# Patient Record
Sex: Female | Born: 1980
Health system: Southern US, Community
[De-identification: ages and names within clinical notes are randomized; demographics above are authoritative.]

## PROBLEM LIST (undated history)

## (undated) DIAGNOSIS — N979 Female infertility, unspecified: Secondary | ICD-10-CM

## (undated) DIAGNOSIS — E079 Disorder of thyroid, unspecified: Secondary | ICD-10-CM

## (undated) DIAGNOSIS — E559 Vitamin D deficiency, unspecified: Secondary | ICD-10-CM

## (undated) HISTORY — DX: Disorder of thyroid, unspecified: E07.9

## (undated) HISTORY — DX: Female infertility, unspecified: N97.9

---
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
## 2006-03-05 ENCOUNTER — Ambulatory Visit: Payer: Self-pay | Admitting: Endocrinology

## 2006-06-13 ENCOUNTER — Encounter: Payer: Self-pay | Admitting: Endocrinology

## 2006-06-13 ENCOUNTER — Ambulatory Visit: Payer: Self-pay | Admitting: Endocrinology

## 2006-06-13 ENCOUNTER — Other Ambulatory Visit: Admission: RE | Admit: 2006-06-13 | Discharge: 2006-06-13 | Payer: Self-pay | Admitting: Endocrinology

## 2006-10-16 ENCOUNTER — Encounter: Payer: Self-pay | Admitting: Endocrinology

## 2006-10-16 DIAGNOSIS — E0590 Thyrotoxicosis, unspecified without thyrotoxic crisis or storm: Secondary | ICD-10-CM | POA: Insufficient documentation

## 2006-12-12 ENCOUNTER — Ambulatory Visit: Payer: Self-pay | Admitting: Endocrinology

## 2007-04-09 ENCOUNTER — Ambulatory Visit: Payer: Self-pay | Admitting: Internal Medicine

## 2007-04-09 DIAGNOSIS — R1084 Generalized abdominal pain: Secondary | ICD-10-CM | POA: Insufficient documentation

## 2007-04-09 LAB — CONVERTED CEMR LAB
AST: 19 units/L (ref 0–37)
Albumin: 3.9 g/dL (ref 3.5–5.2)
Alkaline Phosphatase: 41 units/L (ref 39–117)
Total Bilirubin: 0.5 mg/dL (ref 0.3–1.2)

## 2007-04-18 ENCOUNTER — Encounter: Admission: RE | Admit: 2007-04-18 | Discharge: 2007-04-18 | Payer: Self-pay | Admitting: Internal Medicine

## 2007-07-24 ENCOUNTER — Emergency Department (HOSPITAL_COMMUNITY): Admission: EM | Admit: 2007-07-24 | Discharge: 2007-07-24 | Payer: Self-pay | Admitting: Emergency Medicine

## 2007-07-25 ENCOUNTER — Other Ambulatory Visit: Admission: RE | Admit: 2007-07-25 | Discharge: 2007-07-25 | Payer: Self-pay | Admitting: Obstetrics and Gynecology

## 2009-04-01 IMAGING — US US ABDOMEN COMPLETE
1 series · 14 of 25 positions shown · non-contrast
Comparison: None.

CLINICAL DATA: Right upper quadrant epigastric abdominal pain. 
 ABDOMEN ULTRASOUND:
TECHNIQUE: Complete abdominal ultrasound examination was performed including evaluation of the liver, gallbladder, bile ducts, pancreas, kidneys, spleen, IVC, and abdominal aorta.

[Series 1: us abdomen complete · 0.22mm/px · 14 of 111 slices shown]
[im 1/111]
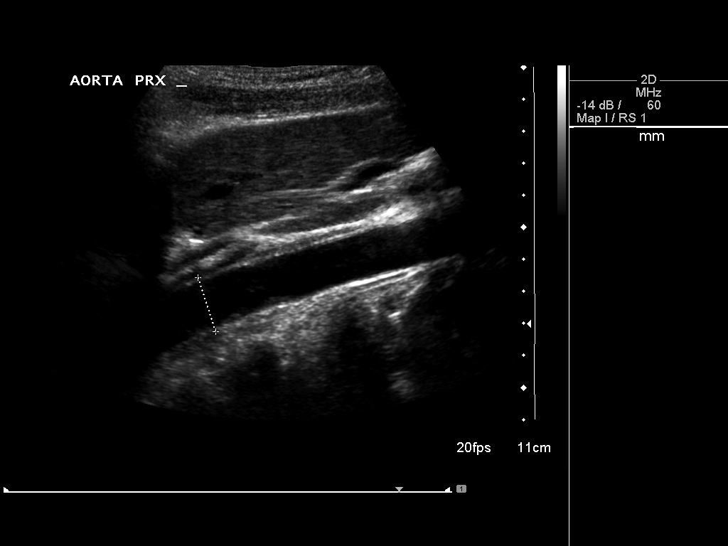
[im 10/111]
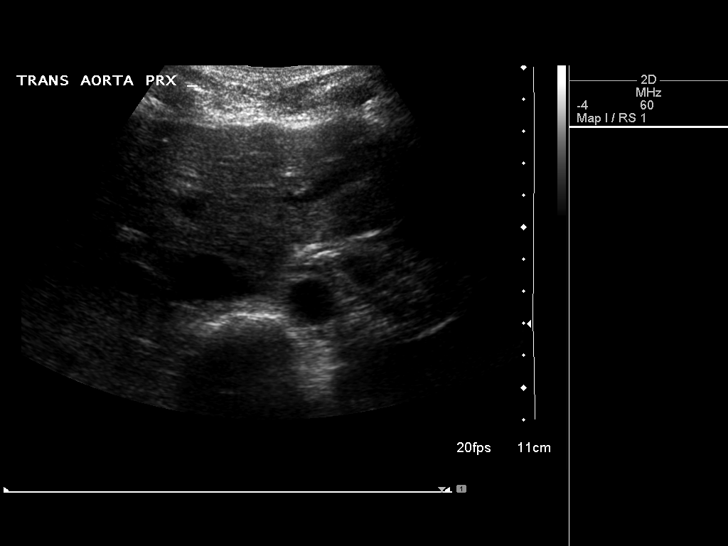
[im 19/111]
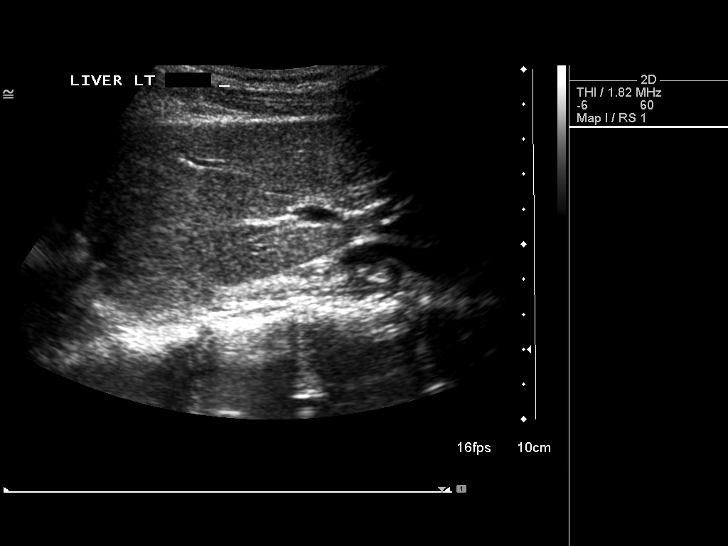
[im 28/111]
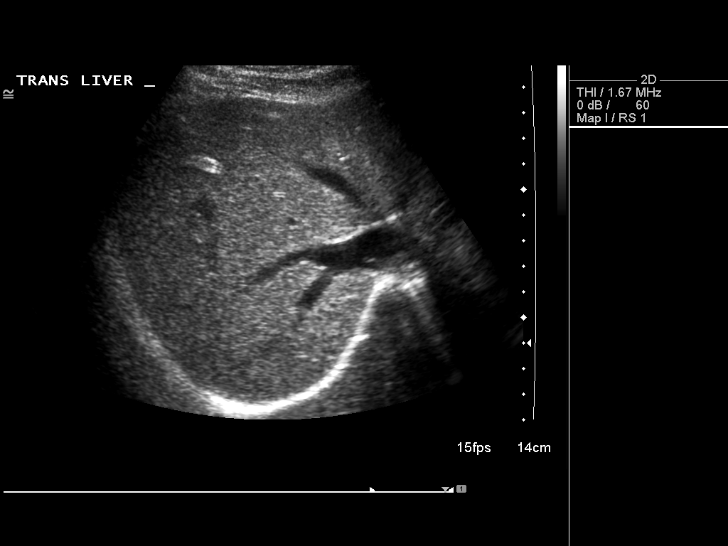
[im 37/111]
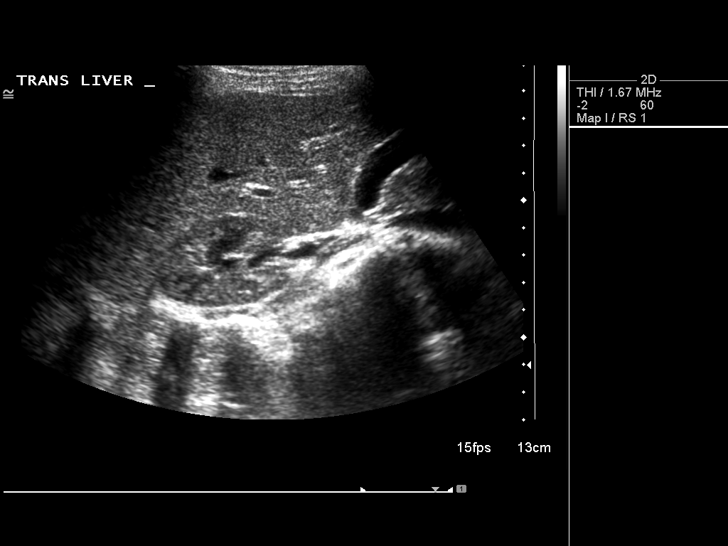
[im 42/111]
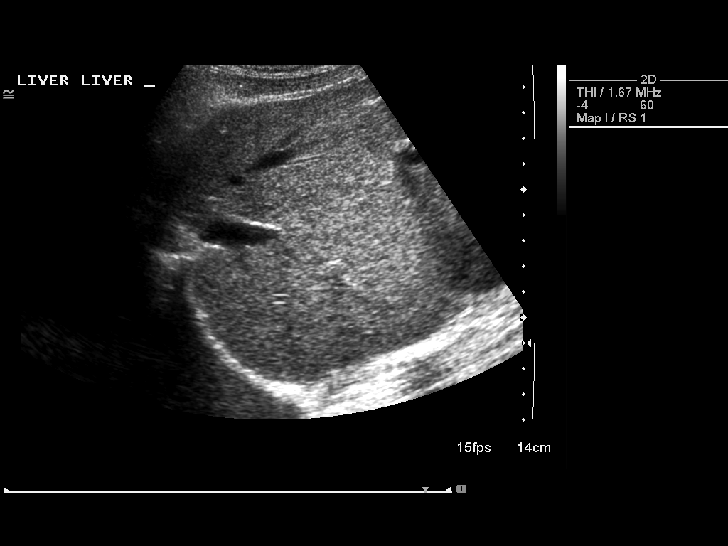
[im 51/111]
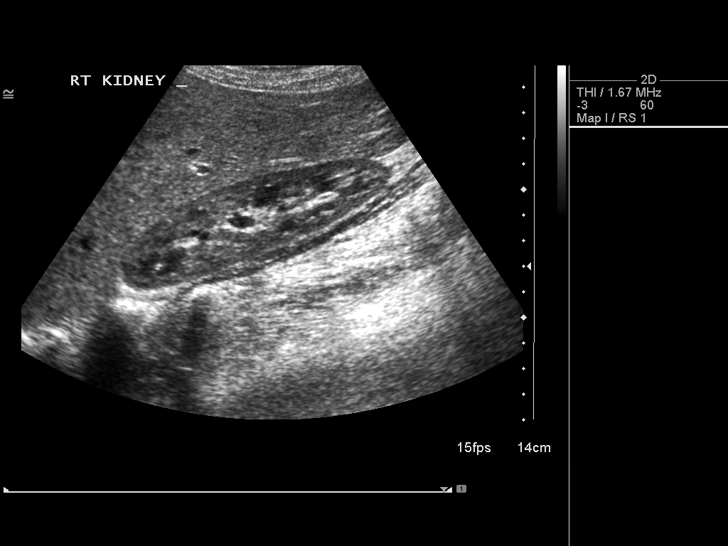
[im 60/111]
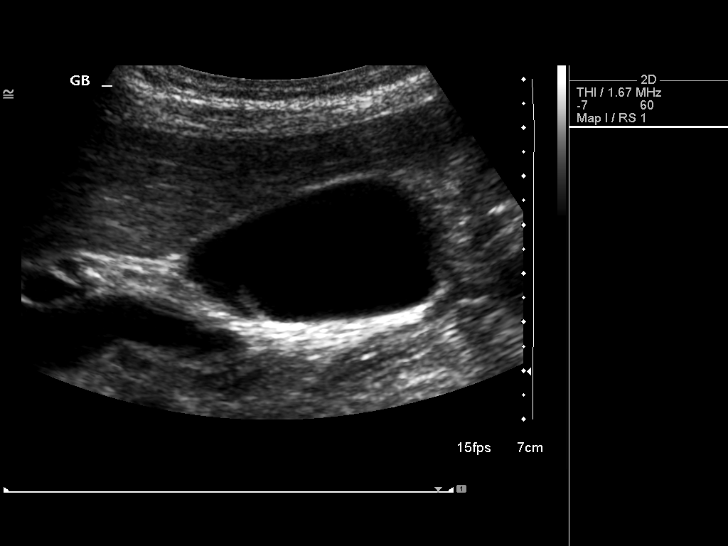
[im 69/111]
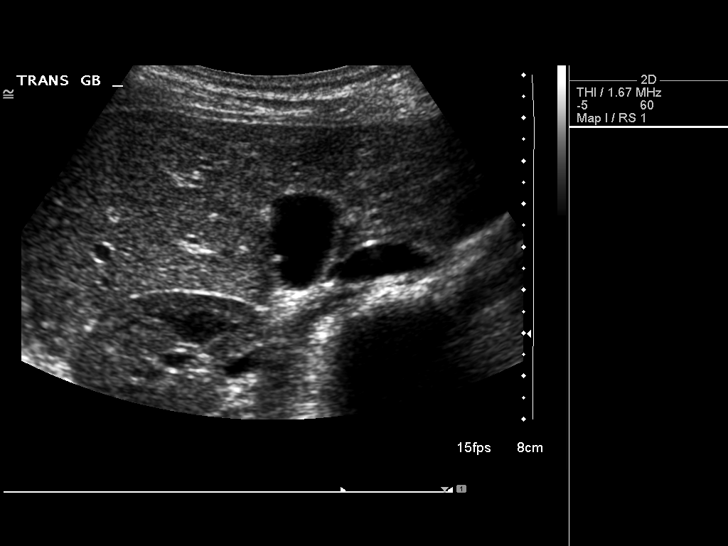
[im 74/111]
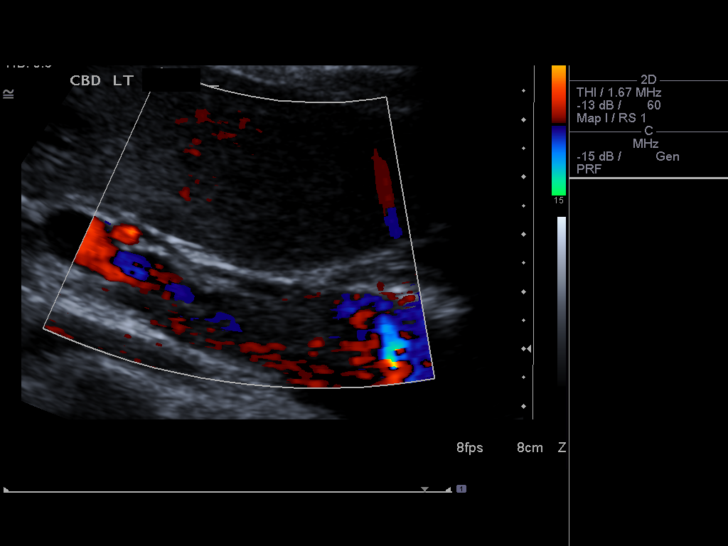
[im 83/111]
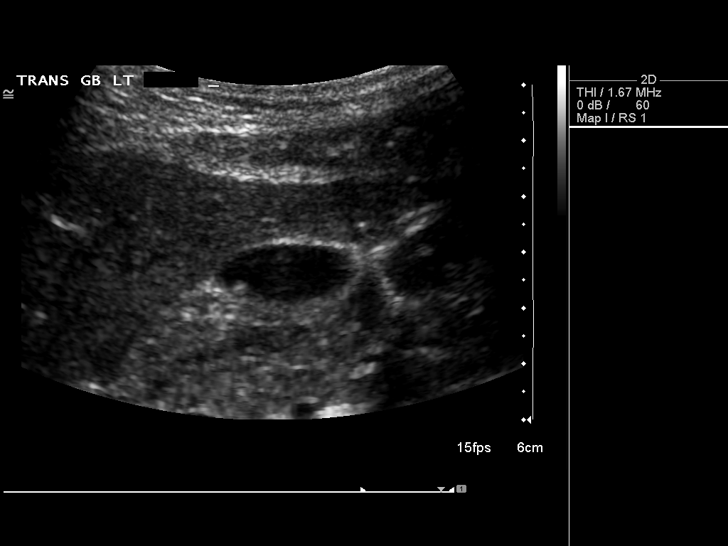
[im 92/111]
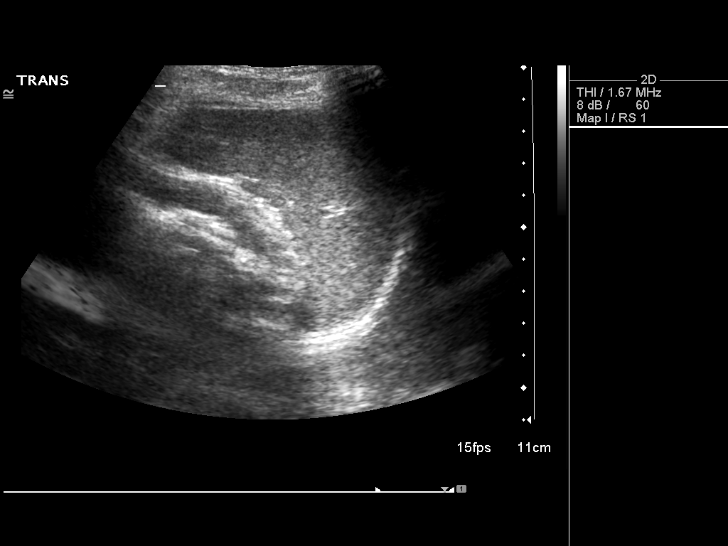
[im 101/111]
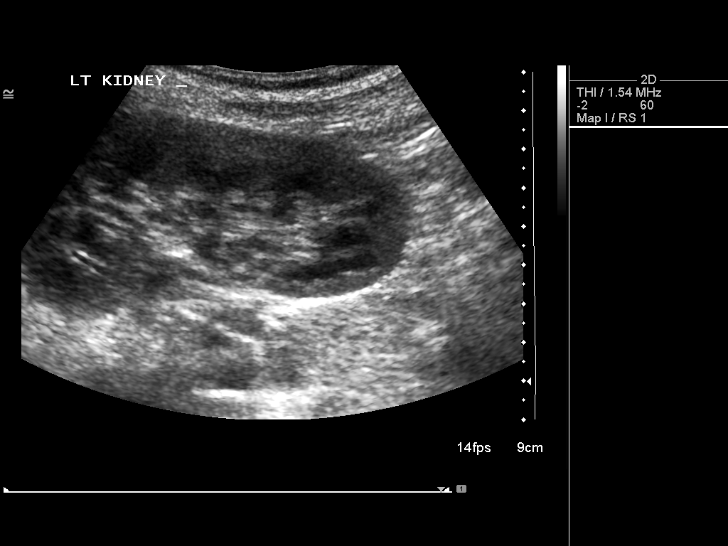
[im 111/111]
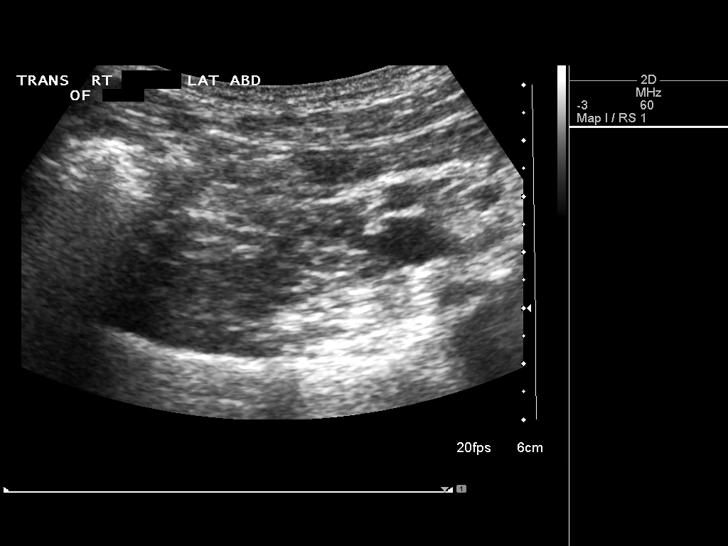

[14 of 25 positions shown; findings below may reference images not displayed]

FINDINGS: 2 mm nonmobile, nonshadowing lateral mural gallbladder probable cholesterol polyp is seen.  Gallbladder is otherwise sonographically normal with 2 mm wall thickness without sludge nor gallstones.  No dilated intrahepatic nor extrahepatic bile ducts are seen with common bile duct measuring normally at 1 mm.  Liver size is upper limits of normal with no focal lesions.  Inferior vena cava, pancreas, spleen (9.2 cm long), right kidney (11.3 cm long), left kidney (10.8 cm long), and abdominal aorta (maximum diameter 1.8 cm) appear sonographically  normal.
IMPRESSION: 1.  Probable incidental 2 mm cholesterol polyp of gallbladder wall.
 2.  Otherwise negative.

## 2010-07-15 NOTE — Consult Note (Signed)
[facility]                          ENDOCRINOLOGY CONSULTATION   OPAL, DINNING                           MRN:          [id number]  DATE:03/05/2006                            DOB:          29-02-1980    NEW PATIENT ENDOCRINOLOGY EVALUATION:  Self-referred.   REASON FOR REFERRAL:  Hyperthyroidism.   HISTORY OF PRESENT ILLNESS:  A 30 year old woman who while living in her  native [place] states she developed an uncertain type of thyroid  condition.  She was prescribed PTU 50 mg a day.  She states that  subsequent thyroid function studies were much better.  She last had this  done 6 months ago.  Symptomatically, she has many years of urinary  frequency and a slight associated weight gain of 5 pounds, but this has  been only over the last year or so.   PAST MEDICAL HISTORY:  Otherwise healthy.   No other medications.   SOCIAL HISTORY:  She is married and she does not work outside the home.  She has been in the [place] for 5 months.   FAMILY HISTORY:  Negative for thyroid disease.   REVIEW OF SYSTEMS:  Denies the following:  Fever, palpitations,  excessive diaphoresis and tremor.   PHYSICAL EXAMINATION:  VITAL SIGNS:  Blood pressure 97/70, heart rate is  81, temperature is 97.5, the weight is 101.  GENERAL:  No distress.  A healthy-appearing young woman.  SKIN:  Normal texture and temperature, not diaphoretic.  HEENT:  No proptosis.  No periorbital swelling.  NECK:  Two times normal size on the left, slightly enlarged on the  right, with a slightly irregular surface but no discrete nodule.  CHEST:  Clear to auscultation.  No respiratory distress.  CARDIOVASCULAR:  No JVD, no edema.  Regular rate and rhythm.  No murmur.  Pedal pulses are intact.  NEUROLOGIC:  Alert and oriented.  Does not appear anxious nor depressed,  and there is no tremor.   LABORATORY DATA:  On March 05, 2006, thyroid function studies and BMET  are normal.   IMPRESSION:  1. Well-controlled hyperthyroidism.  Etiology of the hyperthyroidism      is uncertain, but the most probable is Graves disease.  2. Urinary frequency of uncertain etiology.   PLAN:  1. Same amount of PTU.  2. Return in about 3 months.  3. If you should have any fever on this PTU, discontinue it at once      and call us.  4. I told her that an ultrasound of the thyroid could be considered to      make a more accurate diagnosis, but I do not think that is a      critical point right now.     Sean A. Everardo All, MD  Electronically Signed    SAE/MedQ  DD: 03/06/2006  DT: 03/07/2006  Job #: [id number]

## 2010-11-23 LAB — DIFFERENTIAL
Basophils Absolute: 0
Basophils Relative: 1
Eosinophils Absolute: 0.3
Eosinophils Relative: 4
Lymphocytes Relative: 39

## 2010-11-23 LAB — LIPASE, BLOOD: Lipase: 28

## 2010-11-23 LAB — COMPREHENSIVE METABOLIC PANEL
AST: 20
Alkaline Phosphatase: 31 — ABNORMAL LOW
CO2: 28
Chloride: 109
Creatinine, Ser: 0.67
GFR calc Af Amer: >60
GFR calc non Af Amer: >60
Total Bilirubin: 0.6

## 2010-11-23 LAB — WET PREP, GENITAL: Yeast Wet Prep HPF POC: NONE SEEN

## 2010-11-23 LAB — URINALYSIS, ROUTINE W REFLEX MICROSCOPIC
Bilirubin Urine: NEGATIVE
Glucose, UA: NEGATIVE
Ketones, ur: NEGATIVE
pH: 8

## 2010-11-23 LAB — CBC
HCT: 33.7 — ABNORMAL LOW
MCV: 79.8
RBC: 4.22
WBC: 8.4

## 2012-12-19 ENCOUNTER — Ambulatory Visit: Payer: Self-pay | Admitting: Nurse Practitioner

## 2013-05-01 ENCOUNTER — Encounter: Payer: Self-pay | Admitting: Nurse Practitioner

## 2013-05-02 ENCOUNTER — Telehealth: Payer: Self-pay | Admitting: Nurse Practitioner

## 2013-05-02 ENCOUNTER — Ambulatory Visit: Payer: Self-pay | Admitting: Nurse Practitioner

## 2013-05-02 ENCOUNTER — Ambulatory Visit (INDEPENDENT_AMBULATORY_CARE_PROVIDER_SITE_OTHER): Payer: 59 | Admitting: Nurse Practitioner

## 2013-05-02 ENCOUNTER — Encounter: Payer: Self-pay | Admitting: Nurse Practitioner

## 2013-05-02 VITALS — BP 100/62 | HR 72 | Ht 61.5 in | Wt 103.0 lb

## 2013-05-02 DIAGNOSIS — R8290 Unspecified abnormal findings in urine: Secondary | ICD-10-CM

## 2013-05-02 DIAGNOSIS — R82998 Other abnormal findings in urine: Secondary | ICD-10-CM

## 2013-05-02 DIAGNOSIS — Z01419 Encounter for gynecological examination (general) (routine) without abnormal findings: Secondary | ICD-10-CM

## 2013-05-02 DIAGNOSIS — Z0000 Encounter for general adult medical examination without abnormal findings: Secondary | ICD-10-CM

## 2013-05-02 LAB — POCT URINALYSIS DIPSTICK
Bilirubin, UA: NEGATIVE
Glucose, UA: NEGATIVE
Ketones, UA: NEGATIVE
Nitrite, UA: NEGATIVE
PH UA: 7
PROTEIN UA: NEGATIVE
UROBILINOGEN UA: NEGATIVE

## 2013-05-02 NOTE — Progress Notes (Signed)
Patient ID: Maria Waller, female   DOB: [birth year], 33 y.o.   MRN: [id number] 33 y.o. G0P0 Married Asian Fe here for annual exam.  Menses since October is now regular, lasting about 2-4 days. Flow is light, some cramps first day and relief with OTC NSAID'S.  Married now for 8 years.  Birth control for about 2 years ad trying ever since for pregnancy.  Husband is now 58.  He has fathered a previous child.  She thinks a lot is related to his job and the long hours of working.  They are maybe SA only once a month and has problems with ED.  He may be changing jobs and possibly moving next year.  Patient's last menstrual period was 04/08/2013.          Sexually active: yes  The current method of family planning is none.    Exercising: no  The patient does not participate in regular exercise at present. Smoker:  no  Health Maintenance: Pap:  10/21/10, WNL TDaP:  2007 Labs:  HB:  12.1 Urine:  Trace RBC, 1+ leuk's, pH 7.0 asymptomatic    reports that she has never smoked. She has never used smokeless tobacco. She reports that she does not drink alcohol or use illicit drugs.  Past Medical History  Diagnosis Date  . Infertility, female   . Thyroid disease     hyper, on meds x 1 year then OK    No past surgical history on file.  Current Outpatient Prescriptions  Medication Sig Dispense Refill  . Multiple Vitamin (MULTIVITAMIN) tablet Take 1 tablet by mouth daily.       No current facility-administered medications for this visit.    Family History  Problem Relation Age of Onset  . Gout Mother   . Hyperlipidemia Mother   . Gout Father   . Alcohol abuse Brother   . Thyroid disease Brother     ROS:  Pertinent items are noted in HPI.  Otherwise, a comprehensive ROS was negative.  Exam:   BP 100/62  Pulse 72  Ht 5' 1.5" (1.562 m)  Wt 103 lb (46.72 kg)  BMI 19.15 kg/m2  LMP 04/08/2013 Height: 5' 1.5" (156.2 cm)  Ht Readings from Last 3 Encounters:  05/02/13 5' 1.5" (1.562 m)    General  appearance: alert, cooperative and appears stated age Head: Normocephalic, without obvious abnormality, atraumatic Neck: no adenopathy, supple, symmetrical, trachea midline and thyroid normal to inspection and palpation Lungs: clear to auscultation bilaterally Breasts: normal appearance, no masses or tenderness Heart: regular rate and rhythm Abdomen: soft, non-tender; no masses,  no organomegaly Extremities: extremities normal, atraumatic, no cyanosis or edema Skin: Skin color, texture, turgor normal. No rashes or lesions Lymph nodes: Cervical, supraclavicular, and axillary nodes normal. No abnormal inguinal nodes palpated Neurologic: Grossly normal   Pelvic: External genitalia:  no lesions              Urethra:  normal appearing urethra with no masses, tenderness or lesions              Bartholin's and Skene's: normal                 Vagina: normal appearing vagina with normal color and discharge, no lesions              Cervix: anteverted              Pap taken: yes Bimanual Exam:  Uterus:  normal size, contour, position, consistency, mobility,  non-tender              Adnexa: no mass, fullness, tenderness               Rectovaginal: Confirms               Anus:  normal sphincter tone, no lesions  A:  Well Woman with normal exam  History of Infertility with anovulation and does not want infertility evaluation  Normal menses currently  P:   Pap smear as per guidelines  done today  Counseled on breast self exam, adequate intake of calcium and vitamin D, diet and exercise return annually or prn  An After Visit Summary was printed and given to the patient.

## 2013-05-02 NOTE — Telephone Encounter (Signed)
Patient canceled appt for today at 2:30. Said she had to pick up sick child at school. Offered to rs to next available 05/19/13 patient did not want and did not wish to rs. Said she needed to come in sooner than that and she would just call back and see if we had any cancellations or would call around to other places.

## 2013-05-02 NOTE — Patient Instructions (Signed)

## 2013-05-02 NOTE — Telephone Encounter (Signed)
Patient canceled appt for today at 2:30. Said she had to pick up sick child at school. Offered to rs to next available 05/19/13 patient did not want and did not wish to rs. Said she needed to come in sooner than that and she would just call back and see if we had any cancellations or would call around to other places. (accidently closed original encounter)

## 2013-05-04 LAB — URINE CULTURE
Colony Count: NO GROWTH
Organism ID, Bacteria: NO GROWTH

## 2013-05-05 LAB — HEMOGLOBIN, FINGERSTICK: Hemoglobin, fingerstick: 12.1 g/dL (ref 12.0–16.0)

## 2013-05-06 LAB — IPS PAP TEST WITH HPV

## 2013-05-06 NOTE — Progress Notes (Signed)
Encounter reviewed by Dr. Noeh Sparacino Silva.  

## 2013-05-13 ENCOUNTER — Telehealth: Payer: Self-pay | Admitting: *Deleted

## 2013-05-13 NOTE — Telephone Encounter (Signed)
Message copied by Luisa DagoPHILLIPS, Chelbi Herber C on Tue May 13, 2013  3:07 PM ------      Message from: Roanna BanningGRUBB, PATRICIA R      Created: Sun May 04, 2013  9:47 PM       Let patient know that urine culture was negative. ------

## 2013-05-13 NOTE — Telephone Encounter (Signed)
I have attempted to contact this patient by phone with the following results: left message to return my call on answering machine (home/mobile).  

## 2013-05-15 NOTE — Telephone Encounter (Signed)
Pt notified in result note on 05/14/13.

## 2014-07-28 ENCOUNTER — Ambulatory Visit (INDEPENDENT_AMBULATORY_CARE_PROVIDER_SITE_OTHER): Payer: 59 | Admitting: Nurse Practitioner

## 2014-07-28 ENCOUNTER — Encounter: Payer: Self-pay | Admitting: Nurse Practitioner

## 2014-07-28 VITALS — BP 100/66 | HR 80 | Ht 61.25 in | Wt 105.0 lb

## 2014-07-28 DIAGNOSIS — D509 Iron deficiency anemia, unspecified: Secondary | ICD-10-CM | POA: Diagnosis not present

## 2014-07-28 DIAGNOSIS — Z01419 Encounter for gynecological examination (general) (routine) without abnormal findings: Secondary | ICD-10-CM | POA: Diagnosis not present

## 2014-07-28 DIAGNOSIS — R8290 Unspecified abnormal findings in urine: Secondary | ICD-10-CM

## 2014-07-28 DIAGNOSIS — Z0000 Encounter for general adult medical examination without abnormal findings: Secondary | ICD-10-CM

## 2014-07-28 LAB — POCT URINALYSIS DIPSTICK
BILIRUBIN UA: NEGATIVE
GLUCOSE UA: NEGATIVE
KETONES UA: NEGATIVE
NITRITE UA: NEGATIVE
Protein, UA: NEGATIVE
Urobilinogen, UA: NEGATIVE
pH, UA: 7

## 2014-07-28 LAB — CBC WITH DIFFERENTIAL/PLATELET
BASOS ABS: 0.1 10*3/uL (ref 0.0–0.1)
BASOS PCT: 1 % (ref 0–1)
Eosinophils Absolute: 0.5 10*3/uL (ref 0.0–0.7)
Eosinophils Relative: 5 % (ref 0–5)
HCT: 35 % — ABNORMAL LOW (ref 36.0–46.0)
Hemoglobin: 11.2 g/dL — ABNORMAL LOW (ref 12.0–15.0)
LYMPHS PCT: 28 % (ref 12–46)
Lymphs Abs: 2.6 10*3/uL (ref 0.7–4.0)
MCH: 25.8 pg — AB (ref 26.0–34.0)
MCHC: 32 g/dL (ref 30.0–36.0)
MCV: 80.6 fL (ref 78.0–100.0)
MONOS PCT: 7 % (ref 3–12)
MPV: 9.5 fL (ref 8.6–12.4)
Monocytes Absolute: 0.7 10*3/uL (ref 0.1–1.0)
NEUTROS ABS: 5.5 10*3/uL (ref 1.7–7.7)
Neutrophils Relative %: 59 % (ref 43–77)
Platelets: 284 10*3/uL (ref 150–400)
RBC: 4.34 MIL/uL (ref 3.87–5.11)
RDW: 12.8 % (ref 11.5–15.5)
WBC: 9.4 10*3/uL (ref 4.0–10.5)

## 2014-07-28 LAB — TSH: TSH: 1.209 u[IU]/mL (ref 0.350–4.500)

## 2014-07-28 LAB — FERRITIN: Ferritin: 334 ng/mL — ABNORMAL HIGH (ref 10–291)

## 2014-07-28 NOTE — Patient Instructions (Signed)

## 2014-07-28 NOTE — Progress Notes (Signed)
Encounter reviewed by Dr. Brook Silva.  

## 2014-07-28 NOTE — Progress Notes (Signed)
Patient ID: Maria Waller, female   DOB: 12-07-1980, 34 y.o.   MRN: [id number] 34 y.o. G0P0 Married  Asian ([place] Fe here for annual exam.  Menses ir 3 days and regular. Cramps X 1 day and help with OTC NSAID's.  She is still trying for a pregnancy.  No urinary symptoms.  Patient's last menstrual period was 07/08/2014 (exact date).          Sexually active: Yes.    The current method of family planning is none.    Exercising: No.  The patient does not participate in regular exercise at present. Smoker:  no  Health Maintenance: Pap:  05/02/13, negative with neg HR HPV TDaP:  2007 Labs:  HB:  11.1   Urine:  1+ leuk's, trace RBC   reports that she has never smoked. She has never used smokeless tobacco. She reports that she does not drink alcohol or use illicit drugs.  Past Medical History  Diagnosis Date  . Infertility, female   . Thyroid disease     hyper, on meds x 1 year then OK    History reviewed. No pertinent past surgical history.  Current Outpatient Prescriptions  Medication Sig Dispense Refill  . Multiple Vitamin (MULTIVITAMIN) tablet Take 1 tablet by mouth daily.     No current facility-administered medications for this visit.    Family History  Problem Relation Age of Onset  . Gout Mother   . Hyperlipidemia Mother   . Gout Father   . Alcohol abuse Brother   . Thyroid disease Brother     ROS:  Pertinent items are noted in HPI.  Otherwise, a comprehensive ROS was negative.  Exam:   BP 100/66 mmHg  Pulse 80  Ht 5' 1.25" (1.556 m)  Wt 105 lb (47.628 kg)  BMI 19.67 kg/m2  LMP 07/08/2014 (Exact Date) Height: 5' 1.25" (155.6 cm) Ht Readings from Last 3 Encounters:  07/28/14 5' 1.25" (1.556 m)  05/02/13 5' 1.5" (1.562 m)    General appearance: alert, cooperative and appears stated age Head: Normocephalic, without obvious abnormality, atraumatic Neck: no adenopathy, supple, symmetrical, trachea midline and thyroid normal to inspection and palpation Lungs: clear to  auscultation bilaterally Breasts: normal appearance, no masses or tenderness Heart: regular rate and rhythm Abdomen: soft, non-tender; no masses,  no organomegaly Extremities: extremities normal, atraumatic, no cyanosis or edema Skin: Skin color, texture, turgor normal. No rashes or lesions Lymph nodes: Cervical, supraclavicular, and axillary nodes normal. No abnormal inguinal nodes palpated Neurologic: Grossly normal   Pelvic: External genitalia:  no lesions              Urethra:  normal appearing urethra with no masses, tenderness or lesions              Bartholin's and Skene's: normal                 Vagina: normal appearing vagina with normal color and discharge, no lesions              Cervix: anteverted              Pap taken: No. Bimanual Exam:  Uterus:  normal size, contour, position, consistency, mobility, non-tender              Adnexa: no mass, fullness, tenderness               Rectovaginal: Confirms               Anus:  normal  sphincter tone, no lesions  Chaperone present:  no   A:  Well Woman with normal exam  History of Infertility with anovulation and does not want infertility evaluation Normal menses currently  P:   Reviewed health and wellness pertinent to exam  Pap smear as above  Will do another TSH and follow  Will do CBC and Ferritan to evaluate anemia  Counseled on breast self exam, adequate intake of calcium and vitamin D, diet and exercise return annually or prn  An After Visit Summary was printed and given to the patient.

## 2014-07-29 LAB — URINALYSIS, MICROSCOPIC ONLY
CRYSTALS: NONE SEEN
Casts: NONE SEEN

## 2014-07-29 LAB — STD PANEL
HEP B S AG: NEGATIVE
HIV: NONREACTIVE

## 2014-07-29 LAB — HEMOGLOBIN, FINGERSTICK: HEMOGLOBIN, FINGERSTICK: 11.1 g/dL — AB (ref 12.0–16.0)

## 2014-07-30 ENCOUNTER — Telehealth: Payer: Self-pay | Admitting: Nurse Practitioner

## 2014-07-30 LAB — URINE CULTURE
Colony Count: NO GROWTH
Organism ID, Bacteria: NO GROWTH

## 2014-07-30 NOTE — Telephone Encounter (Signed)
Spoke with patient. Results given as seen below from Maria FranklinPatricia Rolen-Grubb, FNP. Patient is agreeable and verbalizes understanding.  Notes Recorded by [name], CMA on 07/30/2014 at 11:31 AM LM for pt to call back. Notes Recorded by Ria CommentPatricia Grubb, FNP on 07/30/2014 at 8:03 AM Please let pt know that STD panel with HIV, STS, Hep B is all normal. The urine culture and micro also negative. Her CBC and Ferritan level does show iron deficiency anemia with her HGB 11.2 ( here was 11.1 ). The thyroid is normal. Continue on a diet higher in iron rich foods.  Routing to provider for final review. Patient agreeable to disposition. Will close encounter.

## 2014-07-30 NOTE — Telephone Encounter (Signed)
Patient states she wants her results. Patient ok for call back

## 2015-08-02 ENCOUNTER — Encounter: Payer: Self-pay | Admitting: Nurse Practitioner

## 2015-08-02 ENCOUNTER — Ambulatory Visit (INDEPENDENT_AMBULATORY_CARE_PROVIDER_SITE_OTHER): Payer: Commercial Managed Care - HMO | Admitting: Nurse Practitioner

## 2015-08-02 VITALS — BP 100/60 | HR 84 | Resp 16 | Ht 61.25 in | Wt 106.0 lb

## 2015-08-02 DIAGNOSIS — Z23 Encounter for immunization: Secondary | ICD-10-CM

## 2015-08-02 DIAGNOSIS — Z01419 Encounter for gynecological examination (general) (routine) without abnormal findings: Secondary | ICD-10-CM

## 2015-08-02 DIAGNOSIS — Z0000 Encounter for general adult medical examination without abnormal findings: Secondary | ICD-10-CM | POA: Diagnosis not present

## 2015-08-02 NOTE — Progress Notes (Signed)
35 y.o. G0P0 Married  Asian Fe here for annual exam.  Menses is normal lasting 2 days.  Some cramps and takes Midol prn.  Father in law passed this weekend age 34 and funeral is tomorrow.  Patient's last menstrual period was 07/11/2015.          Sexually active: Yes.    The current method of family planning is none.    Exercising: No  The patient does not participate in regular exercise at present. Smoker:  no  Health Maintenance: Pap:  07/28/14-WNL neg HR HPV TDaP:  2007 HIV: 07/28/14-Neg   reports that she has never smoked. She has never used smokeless tobacco. She reports that she does not drink alcohol or use illicit drugs.  Past Medical History  Diagnosis Date  . Infertility, female   . Thyroid disease     hyper, on meds x 1 year then OK    History reviewed. No pertinent past surgical history.  Current Outpatient Prescriptions  Medication Sig Dispense Refill  . Multiple Vitamin (MULTIVITAMIN) tablet Take 1 tablet by mouth daily.     No current facility-administered medications for this visit.    Family History  Problem Relation Age of Onset  . Gout Mother   . Hyperlipidemia Mother   . Gout Father   . Alcohol abuse Brother   . Thyroid disease Brother     ROS:  Pertinent items are noted in HPI.  Otherwise, a comprehensive ROS was negative.  Exam:   BP 100/60 mmHg  Pulse 84  Resp 16  Ht 5' 1.25" (1.556 m)  Wt 106 lb (48.081 kg)  BMI 19.86 kg/m2  LMP 07/11/2015 Height: 5' 1.25" (155.6 cm) Ht Readings from Last 3 Encounters:  08/02/15 5' 1.25" (1.556 m)  07/28/14 5' 1.25" (1.556 m)  05/02/13 5' 1.5" (1.562 m)    General appearance: alert, cooperative and appears stated age Head: Normocephalic, without obvious abnormality, atraumatic Neck: no adenopathy, supple, symmetrical, trachea midline and thyroid normal to inspection and palpation Lungs: clear to auscultation bilaterally Breasts: normal appearance, no masses or tenderness Heart: regular rate and  rhythm Abdomen: soft, non-tender; no masses,  no organomegaly Extremities: extremities normal, atraumatic, no cyanosis or edema Skin: Skin color, texture, turgor normal. No rashes or lesions Lymph nodes: Cervical, supraclavicular, and axillary nodes normal. No abnormal inguinal nodes palpated Neurologic: Grossly normal   Pelvic: External genitalia:  no lesions              Urethra:  normal appearing urethra with no masses, tenderness or lesions              Bartholin's and Skene's: normal                 Vagina: normal appearing vagina with normal color and discharge, no lesions              Cervix: anteverted              Pap taken: No. Bimanual Exam:  Uterus:  normal size, contour, position, consistency, mobility, non-tender              Adnexa: no mass, fullness, tenderness               Rectovaginal: Confirms               Anus:  normal sphincter tone, no lesions  Chaperone present: no  A:  Well Woman with normal exam  History of Infertility with anovulation and does not  want infertility evaluation Normal menses currently  Update immunization  P:   Reviewed health and wellness pertinent to exam  Pap smear as above  If any irregular menses to call  TDaP given today  Counseled on breast self exam, adequate intake of calcium and vitamin D, diet and exercise return annually or prn  An After Visit Summary was printed and given to the patient.   Patient due for Tdap, injection administered at office visit in Right Deltoid. Patient tolerated injection well

## 2015-08-02 NOTE — Patient Instructions (Signed)

## 2015-08-04 NOTE — Progress Notes (Signed)
Encounter reviewed Maria Kirchhoff, MD   

## 2016-08-28 ENCOUNTER — Ambulatory Visit (INDEPENDENT_AMBULATORY_CARE_PROVIDER_SITE_OTHER): Payer: 59 | Admitting: Nurse Practitioner

## 2016-08-28 ENCOUNTER — Encounter: Payer: Self-pay | Admitting: Nurse Practitioner

## 2016-08-28 ENCOUNTER — Other Ambulatory Visit (HOSPITAL_COMMUNITY)
Admission: RE | Admit: 2016-08-28 | Discharge: 2016-08-28 | Disposition: A | Discharge status: Home / Self Care | Payer: 59 | Source: Ambulatory Visit | Attending: Nurse Practitioner | Admitting: Nurse Practitioner

## 2016-08-28 VITALS — BP 100/64 | HR 64 | Resp 16 | Ht 61.5 in | Wt 103.0 lb

## 2016-08-28 DIAGNOSIS — Z0000 Encounter for general adult medical examination without abnormal findings: Secondary | ICD-10-CM | POA: Insufficient documentation

## 2016-08-28 DIAGNOSIS — Z01419 Encounter for gynecological examination (general) (routine) without abnormal findings: Secondary | ICD-10-CM

## 2016-08-28 NOTE — Patient Instructions (Signed)

## 2016-08-28 NOTE — Progress Notes (Signed)
36 y.o. G0P0 Married  Asian Fe here for annual exam.  menses at 28 days and flow is 2-3 days light to spotting.  If eating spicy foods will get diarrhea.  This is only episodic.  Patient's last menstrual period was 08/24/2016 (exact date).          Sexually active: Yes.    The current method of family planning is none.    Exercising: No.  exercise Smoker:  no  Health Maintenance: Pap: 05-02-13 neg HPV HR neg History of Abnormal Pap: no Self Breast exams: no TDaP:  2017  HIV neg 2016 Labs: none   reports that she has never smoked. She has never used smokeless tobacco. She reports that she does not drink alcohol or use drugs.  Past Medical History:  Diagnosis Date  . Infertility, female   . Thyroid disease    hyper, on meds x 1 year then OK    History reviewed. No pertinent surgical history.  Current Outpatient Prescriptions  Medication Sig Dispense Refill  . Multiple Vitamin (MULTIVITAMIN) tablet Take 1 tablet by mouth daily.     No current facility-administered medications for this visit.     Family History  Problem Relation Age of Onset  . Gout Mother   . Hyperlipidemia Mother   . Gout Father   . Alcohol abuse Brother   . Thyroid disease Brother     ROS:  Pertinent items are noted in HPI.  Otherwise, a comprehensive ROS was negative.  Exam:   BP 100/64   Pulse 64   Resp 16   Ht 5' 1.5" (1.562 m)   Wt 103 lb (46.7 kg)   LMP 08/24/2016 (Exact Date)   BMI 19.15 kg/m  Height: 5' 1.5" (156.2 cm) Ht Readings from Last 3 Encounters:  08/28/16 5' 1.5" (1.562 m)  08/02/15 5' 1.25" (1.556 m)  07/28/14 5' 1.25" (1.556 m)    General appearance: alert, cooperative and appears stated age Head: Normocephalic, without obvious abnormality, atraumatic Neck: no adenopathy, supple, symmetrical, trachea midline and thyroid normal to inspection and palpation Lungs: clear to auscultation bilaterally Breasts: normal appearance, no masses or tenderness Heart: regular rate and  rhythm Abdomen: soft, non-tender; no masses,  no organomegaly Extremities: extremities normal, atraumatic, no cyanosis or edema Skin: Skin color, texture, turgor normal. No rashes or lesions Lymph nodes: Cervical, supraclavicular, and axillary nodes normal. No abnormal inguinal nodes palpated Neurologic: Grossly normal   Pelvic: External genitalia:  no lesions              Urethra:  normal appearing urethra with no masses, tenderness or lesions              Bartholin's and Skene's: normal                 Vagina: normal appearing vagina with normal color and discharge, no lesions              Cervix: anteverted              Pap taken: Yes.   Bimanual Exam:  Uterus:  normal size, contour, position, consistency, mobility, non-tender              Adnexa: no mass, fullness, tenderness               Rectovaginal: Confirms               Anus:  normal sphincter tone, no lesions  Chaperone present: yes  A:  Well  Woman with normal exam  History  of infertility and does not want evaluation  Normal menses  History of work exposure to blood and wants STD's  P:   Reviewed health and wellness pertinent to exam  Pap smear: yes  Follow with labs  Counseled on breast self exam, adequate intake of calcium and vitamin D, diet and exercise return annually or prn  An After Visit Summary was printed and given to the patient.

## 2016-08-29 LAB — COMPREHENSIVE METABOLIC PANEL
ALT: 10 IU/L (ref 0–32)
AST: 18 IU/L (ref 0–40)
Albumin/Globulin Ratio: 1.5 (ref 1.2–2.2)
Albumin: 4.2 g/dL (ref 3.5–5.5)
Alkaline Phosphatase: 42 IU/L (ref 39–117)
BUN/Creatinine Ratio: 14 (ref 9–23)
BUN: 10 mg/dL (ref 6–20)
Bilirubin Total: 0.4 mg/dL (ref 0.0–1.2)
CALCIUM: 9 mg/dL (ref 8.7–10.2)
CO2: 26 mmol/L (ref 20–29)
Chloride: 103 mmol/L (ref 96–106)
Creatinine, Ser: 0.71 mg/dL (ref 0.57–1.00)
GFR, EST AFRICAN AMERICAN: 129 mL/min/{1.73_m2} (ref >59)
GFR, EST NON AFRICAN AMERICAN: 111 mL/min/{1.73_m2} (ref >59)
GLUCOSE: 73 mg/dL (ref 65–99)
Globulin, Total: 2.8 g/dL (ref 1.5–4.5)
Potassium: 4.1 mmol/L (ref 3.5–5.2)
Sodium: 142 mmol/L (ref 134–144)
TOTAL PROTEIN: 7 g/dL (ref 6.0–8.5)

## 2016-08-29 LAB — LIPID PANEL
Chol/HDL Ratio: 2.2 ratio (ref 0.0–4.4)
Cholesterol, Total: 152 mg/dL (ref 100–199)
HDL: 69 mg/dL (ref >39)
LDL Calculated: 64 mg/dL (ref 0–99)
Triglycerides: 95 mg/dL (ref 0–149)
VLDL Cholesterol Cal: 19 mg/dL (ref 5–40)

## 2016-08-29 LAB — CYTOLOGY - PAP
Diagnosis: NEGATIVE
HPV: NOT DETECTED

## 2016-08-29 LAB — VITAMIN D 25 HYDROXY (VIT D DEFICIENCY, FRACTURES): VIT D 25 HYDROXY: 22.4 ng/mL — AB (ref 30.0–100.0)

## 2016-08-29 LAB — CBC
HEMATOCRIT: 38.1 % (ref 34.0–46.6)
Hemoglobin: 12 g/dL (ref 11.1–15.9)
MCH: 26.2 pg — ABNORMAL LOW (ref 26.6–33.0)
MCHC: 31.5 g/dL (ref 31.5–35.7)
MCV: 83 fL (ref 79–97)
PLATELETS: 310 10*3/uL (ref 150–379)
RBC: 4.58 x10E6/uL (ref 3.77–5.28)
RDW: 12.6 % (ref 12.3–15.4)
WBC: 6.1 10*3/uL (ref 3.4–10.8)

## 2016-08-29 LAB — HEP, RPR, HIV PANEL
HIV SCREEN 4TH GENERATION: NONREACTIVE
Hepatitis B Surface Ag: NEGATIVE
RPR: NONREACTIVE

## 2016-08-29 LAB — TSH: TSH: 1.41 u[IU]/mL (ref 0.450–4.500)

## 2016-08-30 NOTE — Progress Notes (Signed)
Encounter reviewed by Dr. Uzziah Rigg Amundson C. Silva.  

## 2017-09-13 NOTE — Progress Notes (Signed)
37 y.o. G0P0 Married [place] female here for annual exam.    Hx low vit D.   Has some midline chest discomfort.  Just does not feel quite normal.  Not short of breath.  Feels pressure like something is stepping on her chest.  Rates about 1 or 2 out of 10.  Has Zantac at home.   First day of cycle has menstrual cramping.  Tylenol helps.   Desires pregnancy.  Considering fertility care.  Husband is [id number].   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   PCP:   No PCP    Patient's last menstrual period was 08/20/2017 (exact date).           Sexually active: Yes.    The current method of family planning is none.    Exercising: Yes.    walking at work Smoker:  no   Health Maintenance: Pap:  08-28-16 negative, HR HPV negative          05-02-13 negative, HR HPV negative  History of abnormal Pap:  no MMG:  n/a Colonoscopy:  n/a BMD:   n/a  Result  n/a TDaP:  08-02-15  Gardasil:   No.   HIV: 08-28-16 negative  Hep C: Screening Labs:  Discuss today   reports that she has never smoked. She has never used smokeless tobacco. She reports that she does not drink alcohol or use drugs.  Past Medical History:  Diagnosis Date  . Infertility, female   . Thyroid disease    hyper, on meds x 1 year then OK    History reviewed. No pertinent surgical history.  Current Outpatient Medications  Medication Sig Dispense Refill  . Multiple Vitamin (MULTIVITAMIN) tablet Take 1 tablet by mouth daily.     No current facility-administered medications for this visit.     Family History  Problem Relation Age of Onset  . Gout Mother   . Hyperlipidemia Mother   . Gout Father   . Alcohol abuse Brother   . Thyroid disease Brother     Review of Systems  Constitutional: Negative.   HENT: Negative.   Eyes: Negative.   Respiratory: Negative.   Cardiovascular: Negative.   Gastrointestinal: Negative.   Endocrine: Negative.   Genitourinary: Negative.   Musculoskeletal: Negative.   Skin: Negative.    Allergic/Immunologic: Negative.   Neurological: Negative.   Hematological: Negative.   Psychiatric/Behavioral: Negative.     Exam:   BP 92/66 (BP Location: Right Arm, Patient Position: Sitting, Cuff Size: Normal)   Pulse 72   Ht 5' 1.5" (1.562 m)   Wt 104 lb (47.2 kg)   LMP 08/20/2017 (Exact Date)   BMI 19.33 kg/m     General appearance: alert, cooperative and appears stated age Head: Normocephalic, without obvious abnormality, atraumatic Neck: no adenopathy, supple, symmetrical, trachea midline and thyroid normal to inspection and palpation Lungs: clear to auscultation bilaterally Breasts: normal appearance, no masses or tenderness, No nipple retraction or dimpling, No nipple discharge or bleeding, No axillary or supraclavicular adenopathy Heart: regular rate and rhythm Abdomen: soft, non-tender; no masses, no organomegaly Extremities: extremities normal, atraumatic, no cyanosis or edema Skin: Skin color, texture, turgor normal. No rashes or lesions Lymph nodes: Cervical, supraclavicular, and axillary nodes normal. No abnormal inguinal nodes palpated Neurologic: Grossly normal  Pelvic: External genitalia:  no lesions              Urethra:  normal appearing urethra with no masses, tenderness or lesions  Bartholins and Skenes: normal                 Vagina: normal appearing vagina with normal color and discharge, no lesions              Cervix: no lesions              Pap taken: No. Bimanual Exam:  Uterus:  normal size, contour, position, consistency, mobility, non-tender              Adnexa: no mass, fullness, tenderness                Chaperone was present for exam.  Assessment:   Well woman visit with normal exam. Hx infertility. Hx hyperthyroidism.  Chest pressure.  Not acute.  Plan: Mammogram screening. Recommended self breast awareness. Pap and HR HPV as above. Guidelines for Calcium, Vitamin D, regular exercise program including cardiovascular and  weight bearing exercise. PNV.  We discussed her AMA status for pregnancy.  Routine labs, free T4 and TSH.  Gardasil discussed.  Ok for trial of Zantac.  Referral to PCP.  Follow up annually and prn.   After visit summary provided.

## 2017-09-14 ENCOUNTER — Encounter: Payer: Self-pay | Admitting: Obstetrics and Gynecology

## 2017-09-14 ENCOUNTER — Ambulatory Visit: Payer: 59 | Admitting: Obstetrics and Gynecology

## 2017-09-14 VITALS — BP 92/66 | HR 72 | Ht 61.5 in | Wt 104.0 lb

## 2017-09-14 DIAGNOSIS — Z01419 Encounter for gynecological examination (general) (routine) without abnormal findings: Secondary | ICD-10-CM | POA: Diagnosis not present

## 2017-09-14 DIAGNOSIS — Z8639 Personal history of other endocrine, nutritional and metabolic disease: Secondary | ICD-10-CM

## 2017-09-14 NOTE — Patient Instructions (Addendum)
EXERCISE AND DIET:  We recommended that you start or continue a regular exercise program for good health. Regular exercise means any activity that makes your heart beat faster and makes you sweat.  We recommend exercising at least 30 minutes per day at least 3 days a week, preferably 4 or 5.  We also recommend a diet low in fat and sugar.  Inactivity, poor dietary choices and obesity can cause diabetes, heart attack, stroke, and kidney damage, among others.    ALCOHOL AND SMOKING:  Women should limit their alcohol intake to no more than 7 drinks/beers/glasses of wine (combined, not each!) per week. Moderation of alcohol intake to this level decreases your risk of breast cancer and liver damage. And of course, no recreational drugs are part of a healthy lifestyle.  And absolutely no smoking or even second hand smoke. Most people know smoking can cause heart and lung diseases, but did you know it also contributes to weakening of your bones? Aging of your skin?  Yellowing of your teeth and nails?  CALCIUM AND VITAMIN D:  Adequate intake of calcium and Vitamin D are recommended.  The recommendations for exact amounts of these supplements seem to change often, but generally speaking 600 mg of calcium (either carbonate or citrate) and 800 units of Vitamin D per day seems prudent. Certain women may benefit from higher intake of Vitamin D.  If you are among these women, your doctor will have told you during your visit.    PAP SMEARS:  Pap smears, to check for cervical cancer or precancers,  have traditionally been done yearly, although recent scientific advances have shown that most women can have pap smears less often.  However, every woman still should have a physical exam from her gynecologist every year. It will include a breast check, inspection of the vulva and vagina to check for abnormal growths or skin changes, a visual exam of the cervix, and then an exam to evaluate the size and shape of the uterus and  ovaries.  And after 36 years of age, a rectal exam is indicated to check for rectal cancers. We will also provide age appropriate advice regarding health maintenance, like when you should have certain vaccines, screening for sexually transmitted diseases, bone density testing, colonoscopy, mammograms, etc.   MAMMOGRAMS:  All women over 40 years old should have a yearly mammogram. Many facilities now offer a "3D" mammogram, which may cost around $50 extra out of pocket. If possible,  we recommend you accept the option to have the 3D mammogram performed.  It both reduces the number of women who will be called back for extra views which then turn out to be normal, and it is better than the routine mammogram at detecting truly abnormal areas.    COLONOSCOPY:  Colonoscopy to screen for colon cancer is recommended for all women at age 50.  We know, you hate the idea of the prep.  We agree, BUT, having colon cancer and not knowing it is worse!!  Colon cancer so often starts as a polyp that can be seen and removed at colonscopy, which can quite literally save your life!  And if your first colonoscopy is normal and you have no family history of colon cancer, most women don't have to have it again for 10 years.  Once every ten years, you can do something that may end up saving your life, right?  We will be happy to help you get it scheduled when you are ready.    Be sure to check your insurance coverage so you understand how much it will cost.  It may be covered as a preventative service at no cost, but you should check your particular policy.     HPV (Human Papillomavirus) Vaccine: What You Need to Know 1. Why get vaccinated? HPV vaccine prevents infection with human papillomavirus (HPV) types that are associated with many cancers, including:  cervical cancer in females,  vaginal and vulvar cancers in females,  anal cancer in females and males,  throat cancer in females and males, and  penile cancer in  males.  In addition, HPV vaccine prevents infection with HPV types that cause genital warts in both females and males. In the [place] about 12,000 women get cervical cancer every year, and about 4,000 women die from it. HPV vaccine can prevent most of these cases of cervical cancer. Vaccination is not a substitute for cervical cancer screening. This vaccine does not protect against all HPV types that can cause cervical cancer. Women should still get regular Pap tests. HPV infection usually comes from sexual contact, and most people will become infected at some point in their life. About 14 million Americans, including teens, get infected every year. Most infections will go away on their own and not cause serious problems. But thousands of women and men get cancer and other diseases from HPV. 2. HPV vaccine HPV vaccine is approved by FDA and is recommended by CDC for both males and females. It is routinely given at 11 or 36 years of age, but it may be given beginning at age 9 years through age 26 years. Most adolescents 9 through 36 years of age should get HPV vaccine as a two-dose series with the doses separated by 6-12 months. People who start HPV vaccination at 15 years of age and older should get the vaccine as a three-dose series with the second dose given 1-2 months after the first dose and the third dose given 6 months after the first dose. There are several exceptions to these age recommendations. Your health care provider can give you more information. 3. Some people should not get this vaccine  Anyone who has had a severe (life-threatening) allergic reaction to a dose of HPV vaccine should not get another dose.  Anyone who has a severe (life threatening) allergy to any component of HPV vaccine should not get the vaccine.  Tell your doctor if you have any severe allergies that you know of, including a severe allergy to yeast.  HPV vaccine is not recommended for pregnant women. If you learn  that you were pregnant when you were vaccinated, there is no reason to expect any problems for you or your baby. Any woman who learns she was pregnant when she got HPV vaccine is encouraged to contact the manufacturer's registry for HPV vaccination during pregnancy at 1-800-986-8999. Women who are breastfeeding may be vaccinated.  If you have a mild illness, such as a cold, you can probably get the vaccine today. If you are moderately or severely ill, you should probably wait until you recover. Your doctor can advise you. 4. Risks of a vaccine reaction With any medicine, including vaccines, there is a chance of side effects. These are usually mild and go away on their own, but serious reactions are also possible. Most people who get HPV vaccine do not have any serious problems with it. Mild or moderate problems following HPV vaccine:  Reactions in the arm where the shot was given: ? Soreness (about   9 people in 10) ? Redness or swelling (about 1 person in 3)  Fever: ? Mild (100F) (about 1 person in 10) ? Moderate (102F) (about 1 person in 65)  Other problems: ? Headache (about 1 person in 3) Problems that could happen after any injected vaccine:  People sometimes faint after a medical procedure, including vaccination. Sitting or lying down for about 15 minutes can help prevent fainting, and injuries caused by a fall. Tell your doctor if you feel dizzy, or have vision changes or ringing in the ears.  Some people get severe pain in the shoulder and have difficulty moving the arm where a shot was given. This happens very rarely.  Any medication can cause a severe allergic reaction. Such reactions from a vaccine are very rare, estimated at about 1 in a million doses, and would happen within a few minutes to a few hours after the vaccination. As with any medicine, there is a very remote chance of a vaccine causing a serious injury or death. The safety of vaccines is always being monitored. For  more information, visit: www.cdc.gov/vaccinesafety/. 5. What if there is a serious reaction? What should I look for? Look for anything that concerns you, such as signs of a severe allergic reaction, very high fever, or unusual behavior. Signs of a severe allergic reaction can include hives, swelling of the face and throat, difficulty breathing, a fast heartbeat, dizziness, and weakness. These would usually start a few minutes to a few hours after the vaccination. What should I do? If you think it is a severe allergic reaction or other emergency that can't wait, call 9-1-1 or get to the nearest hospital. Otherwise, call your doctor. Afterward, the reaction should be reported to the Vaccine Adverse Event Reporting System (VAERS). Your doctor should file this report, or you can do it yourself through the VAERS web site at www.vaers.hhs.gov, or by calling 1-800-822-7967. VAERS does not give medical advice. 6. The National Vaccine Injury Compensation Program The National Vaccine Injury Compensation Program (VICP) is a federal program that was created to compensate people who may have been injured by certain vaccines. Persons who believe they may have been injured by a vaccine can learn about the program and about filing a claim by calling 1-800-338-2382 or visiting the VICP website at www.hrsa.gov/vaccinecompensation. There is a time limit to file a claim for compensation. 7. How can I learn more?  Ask your health care provider. He or she can give you the vaccine package insert or suggest other sources of information.  Call your local or state health department.  Contact the Centers for Disease Control and Prevention (CDC): ? Call 1-800-232-4636 (1-800-CDC-INFO) or ? Visit CDC's website at www.cdc.gov/hpv Vaccine Information Statement, HPV Vaccine (01/29/2015) This information is not intended to replace advice given to you by your health care provider. Make sure you discuss any questions you have  with your health care provider. Document Released: 09/10/2013 Document Revised: 11/04/2015 Document Reviewed: 11/04/2015 Elsevier Interactive Patient Education  2017 Elsevier Inc.  

## 2017-09-14 NOTE — Progress Notes (Signed)
Scheduled for PCP appointment at [facility] with PA Eddie Candleummings. Printed intake paperwork and patient agreeable to time date. 10/03/17 at 0830.

## 2017-09-15 LAB — LIPID PANEL
CHOL/HDL RATIO: 2.1 ratio (ref 0.0–4.4)
Cholesterol, Total: 151 mg/dL (ref 100–199)
HDL: 72 mg/dL (ref >39)
LDL CALC: 65 mg/dL (ref 0–99)
TRIGLYCERIDES: 68 mg/dL (ref 0–149)
VLDL Cholesterol Cal: 14 mg/dL (ref 5–40)

## 2017-09-15 LAB — COMPREHENSIVE METABOLIC PANEL
ALK PHOS: 41 IU/L (ref 39–117)
ALT: 12 IU/L (ref 0–32)
AST: 18 IU/L (ref 0–40)
Albumin/Globulin Ratio: 1.6 (ref 1.2–2.2)
Albumin: 4.6 g/dL (ref 3.5–5.5)
BILIRUBIN TOTAL: 0.4 mg/dL (ref 0.0–1.2)
BUN/Creatinine Ratio: 15 (ref 9–23)
BUN: 11 mg/dL (ref 6–20)
CHLORIDE: 103 mmol/L (ref 96–106)
CO2: 24 mmol/L (ref 20–29)
CREATININE: 0.72 mg/dL (ref 0.57–1.00)
Calcium: 9.7 mg/dL (ref 8.7–10.2)
GFR calc Af Amer: 125 mL/min/{1.73_m2} (ref >59)
GFR calc non Af Amer: 109 mL/min/{1.73_m2} (ref >59)
GLOBULIN, TOTAL: 2.8 g/dL (ref 1.5–4.5)
Glucose: 89 mg/dL (ref 65–99)
POTASSIUM: 3.6 mmol/L (ref 3.5–5.2)
Sodium: 141 mmol/L (ref 134–144)
Total Protein: 7.4 g/dL (ref 6.0–8.5)

## 2017-09-15 LAB — CBC
Hematocrit: 37.5 % (ref 34.0–46.6)
Hemoglobin: 12.1 g/dL (ref 11.1–15.9)
MCH: 26.2 pg — ABNORMAL LOW (ref 26.6–33.0)
MCHC: 32.3 g/dL (ref 31.5–35.7)
MCV: 81 fL (ref 79–97)
Platelets: 335 10*3/uL (ref 150–450)
RBC: 4.61 x10E6/uL (ref 3.77–5.28)
RDW: 12.7 % (ref 12.3–15.4)
WBC: 9 10*3/uL (ref 3.4–10.8)

## 2017-09-15 LAB — VITAMIN D 25 HYDROXY (VIT D DEFICIENCY, FRACTURES): VIT D 25 HYDROXY: 27.8 ng/mL — AB (ref 30.0–100.0)

## 2017-09-15 LAB — T4, FREE: Free T4: 1.41 ng/dL (ref 0.82–1.77)

## 2017-09-15 LAB — TSH: TSH: 0.914 u[IU]/mL (ref 0.450–4.500)

## 2017-09-17 ENCOUNTER — Telehealth: Payer: Self-pay | Admitting: *Deleted

## 2017-09-17 NOTE — Telephone Encounter (Signed)
Spoke with patient. Advised as seen below per Dr. Silva.  Patient verbalizes understanding and is agreeable.   Encounter closed.  

## 2017-09-17 NOTE — Telephone Encounter (Signed)
Notes recorded by Leda MinHamm, Aquarius Latouche N, RN on 09/17/2017 at 11:36 AM EDT Left message to call Noreene LarssonJill at [phone].

## 2017-09-17 NOTE — Telephone Encounter (Signed)
-----   Message from Patton SallesBrook E Amundson C Silva, MD sent at 09/16/2017  1:04 PM EDT ----- Please report results of testing to patient.  Her vit D is slightly low, and I am recommending she take vit D3 800 IU daily.  The remained of her blood work looks good.  This includes her CBC, CMP, cholesterol, and thyroid function.

## 2017-10-03 ENCOUNTER — Ambulatory Visit (INDEPENDENT_AMBULATORY_CARE_PROVIDER_SITE_OTHER): Payer: 59 | Admitting: Physician Assistant

## 2017-10-03 ENCOUNTER — Encounter: Payer: Self-pay | Admitting: Physician Assistant

## 2017-10-03 VITALS — BP 107/73 | HR 73 | Resp 14 | Ht 62.21 in | Wt 107.0 lb

## 2017-10-03 DIAGNOSIS — Z8639 Personal history of other endocrine, nutritional and metabolic disease: Secondary | ICD-10-CM | POA: Diagnosis not present

## 2017-10-03 DIAGNOSIS — R058 Other specified cough: Secondary | ICD-10-CM | POA: Insufficient documentation

## 2017-10-03 DIAGNOSIS — Z7689 Persons encountering health services in other specified circumstances: Secondary | ICD-10-CM

## 2017-10-03 DIAGNOSIS — R002 Palpitations: Secondary | ICD-10-CM | POA: Insufficient documentation

## 2017-10-03 DIAGNOSIS — R05 Cough: Secondary | ICD-10-CM

## 2017-10-03 DIAGNOSIS — R1013 Epigastric pain: Secondary | ICD-10-CM | POA: Insufficient documentation

## 2017-10-03 MED ORDER — RANITIDINE HCL 150 MG PO TABS: 150.0000 mg | ORAL_TABLET | ORAL | 1 refills | Status: DC

## 2017-10-03 NOTE — Patient Instructions (Addendum)
- keep a diary of your symptoms over the next month (include date, what you were doing when they occurred, duration of palpitations, associated symptoms) - avoid caffeine (coffee, tea, soda) - start Ranitidine every morning before breakfast. This is to treat your cough by treating any underlying acid reflux/heartburn - follow a GERD diet   Palpitations A palpitation is the feeling that your heart:  Has an uneven (irregular) heartbeat.  Is beating faster than normal.  Is fluttering.  Is skipping a beat.  This is usually not a serious problem. In some cases, you may need more medical tests. Follow these instructions at home:  Avoid: ? Caffeine in coffee, tea, soft drinks, diet pills, and energy drinks. ? Chocolate. ? Alcohol.  Do not use any tobacco products. These include cigarettes, chewing tobacco, and e-cigarettes. If you need help quitting, ask your doctor.  Try to reduce your stress. These things may help: ? Yoga. ? Meditation. ? Physical activity. Swimming, jogging, and walking are good choices. ? A method that helps you use your mind to control things in your body, like heartbeats (biofeedback).  Get plenty of rest and sleep.  Take over-the-counter and prescription medicines only as told by your doctor.  Keep all follow-up visits as told by your doctor. This is important. Contact a doctor if:  Your heartbeat is still fast or uneven after 24 hours.  Your palpitations occur more often. Get help right away if:  You have chest pain.  You feel short of breath.  You have a very bad headache.  You feel dizzy.  You pass out (faint). This information is not intended to replace advice given to you by your health care provider. Make sure you discuss any questions you have with your health care provider. Document Released: 11/23/2007 Document Revised: 07/22/2015 Document Reviewed: 10/29/2014 Elsevier Interactive Patient Education  2018 [employer]   Food  Choices for Gastroesophageal Reflux Disease, Adult When you have gastroesophageal reflux disease (GERD), the foods you eat and your eating habits are very important. Choosing the right foods can help ease your discomfort. What guidelines do I need to follow?  Choose fruits, vegetables, whole grains, and low-fat dairy products.  Choose low-fat meat, fish, and poultry.  Limit fats such as oils, salad dressings, butter, nuts, and avocado.  Keep a food diary. This helps you identify foods that cause symptoms.  Avoid foods that cause symptoms. These may be different for everyone.  Eat small meals often instead of 3 large meals a day.  Eat your meals slowly, in a place where you are relaxed.  Limit fried foods.  Cook foods using methods other than frying.  Avoid drinking alcohol.  Avoid drinking large amounts of liquids with your meals.  Avoid bending over or lying down until 2-3 hours after eating. What foods are not recommended? These are some foods and drinks that may make your symptoms worse: Vegetables Tomatoes. Tomato juice. Tomato and spaghetti sauce. Chili peppers. Onion and garlic. Horseradish. Fruits Oranges, grapefruit, and lemon (fruit and juice). Meats High-fat meats, fish, and poultry. This includes hot dogs, ribs, ham, sausage, salami, and bacon. Dairy Whole milk and chocolate milk. Sour cream. Cream. Butter. Ice cream. Cream cheese. Drinks Coffee and tea. Bubbly (carbonated) drinks or energy drinks. Condiments Hot sauce. Barbecue sauce. Sweets/Desserts Chocolate and cocoa. Donuts. Peppermint and spearmint. Fats and Oils High-fat foods. This includes [place] fries and potato chips. Other Vinegar. Strong spices. This includes black pepper, white pepper, red pepper, cayenne, curry powder,  cloves, ginger, and chili powder. The items listed above may not be a complete list of foods and drinks to avoid. Contact your dietitian for more information. This information  is not intended to replace advice given to you by your health care provider. Make sure you discuss any questions you have with your health care provider. Document Released: 08/15/2011 Document Revised: 07/22/2015 Document Reviewed: 12/18/2012 Elsevier Interactive Patient Education  2017 [employer].
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
## 2017-10-04 LAB — THYROGLOBULIN LEVEL: Thyroglobulin: 0.8 ng/mL — ABNORMAL LOW

## 2017-10-04 LAB — THYROID PANEL WITH TSH
Free Thyroxine Index: 2.6 (ref 1.4–3.8)
T3 Uptake: 31 % (ref 22–35)
T4, Total: 8.3 ug/dL (ref 5.1–11.9)
TSH: 0.75 mIU/L

## 2017-10-05 ENCOUNTER — Ambulatory Visit (INDEPENDENT_AMBULATORY_CARE_PROVIDER_SITE_OTHER): Payer: 59

## 2017-10-05 DIAGNOSIS — Z8639 Personal history of other endocrine, nutritional and metabolic disease: Secondary | ICD-10-CM

## 2017-10-05 DIAGNOSIS — E0590 Thyrotoxicosis, unspecified without thyrotoxic crisis or storm: Secondary | ICD-10-CM | POA: Diagnosis not present

## 2017-10-31 ENCOUNTER — Encounter: Payer: Self-pay | Admitting: Physician Assistant

## 2017-10-31 ENCOUNTER — Ambulatory Visit: Payer: 59 | Admitting: Physician Assistant

## 2017-10-31 VITALS — BP 89/60 | HR 82 | Temp 98.0°F | Wt 104.0 lb

## 2017-10-31 DIAGNOSIS — R05 Cough: Secondary | ICD-10-CM

## 2017-10-31 DIAGNOSIS — R1013 Epigastric pain: Secondary | ICD-10-CM | POA: Diagnosis not present

## 2017-10-31 DIAGNOSIS — R058 Other specified cough: Secondary | ICD-10-CM

## 2017-10-31 MED ORDER — OMEPRAZOLE 20 MG PO CPDR: 20.0000 mg | DELAYED_RELEASE_CAPSULE | Freq: Every day | ORAL | 3 refills | Status: DC

## 2017-10-31 NOTE — Patient Instructions (Signed)
Food Choices for Gastroesophageal Reflux Disease, Adult When you have gastroesophageal reflux disease (GERD), the foods you eat and your eating habits are very important. Choosing the right foods can help ease your discomfort. What guidelines do I need to follow?  Choose fruits, vegetables, whole grains, and low-fat dairy products.  Choose low-fat meat, fish, and poultry.  Limit fats such as oils, salad dressings, butter, nuts, and avocado.  Keep a food diary. This helps you identify foods that cause symptoms.  Avoid foods that cause symptoms. These may be different for everyone.  Eat small meals often instead of 3 large meals a day.  Eat your meals slowly, in a place where you are relaxed.  Limit fried foods.  Cook foods using methods other than frying.  Avoid drinking alcohol.  Avoid drinking large amounts of liquids with your meals.  Avoid bending over or lying down until 2-3 hours after eating. What foods are not recommended? These are some foods and drinks that may make your symptoms worse: Vegetables  Tomatoes. Tomato juice. Tomato and spaghetti sauce. Chili peppers. Onion and garlic. Horseradish. Fruits  Oranges, grapefruit, and lemon (fruit and juice). Meats  High-fat meats, fish, and poultry. This includes hot dogs, ribs, ham, sausage, salami, and bacon. Dairy  Whole milk and chocolate milk. Sour cream. Cream. Butter. Ice cream. Cream cheese. Drinks  Coffee and tea. Bubbly (carbonated) drinks or energy drinks. Condiments  Hot sauce. Barbecue sauce. Sweets/Desserts  Chocolate and cocoa. Donuts. Peppermint and spearmint. Fats and Oils  High-fat foods. This includes French fries and potato chips. Other  Vinegar. Strong spices. This includes black pepper, white pepper, red pepper, cayenne, curry powder, cloves, ginger, and chili powder. The items listed above may not be a complete list of foods and drinks to avoid. Contact your dietitian for more information.    This information is not intended to replace advice given to you by your health care provider. Make sure you discuss any questions you have with your health care provider. Document Released: 08/15/2011 Document Revised: 07/22/2015 Document Reviewed: 12/18/2012 Elsevier Interactive Patient Education  2017 Elsevier Inc.  

## 2017-10-31 NOTE — Progress Notes (Signed)
HPI:                                                                Maria Waller is a 37 y.o. female who presents to [facility] today for follow-up cough / palpitations  Since last office visit - has continued to have waxing and waning non-productive cough associated with some heartburn and nausea - CBC, CMP unremarkable  - she tried Randitidine on two occasions, but it caused drowsiness, so she discontinued this - symptoms are worse with spicy foods and on days when she gets up very early for work. She frequently skips breakfast - no additional palpitations - denies fever, chills, dyspnea, wheezing, chest pain  Depression screen [facility] 2/9 10/03/2017  Decreased Interest 0  Down, Depressed, Hopeless 0  PHQ - 2 Score 0    No flowsheet data found.    Past Medical History:  Diagnosis Date  . Infertility, female   . Thyroid disease    hyper, on meds x 1 year then OK   No past surgical history on file. Social History   Tobacco Use  . Smoking status: Never Smoker  . Smokeless tobacco: Never Used  Substance Use Topics  . Alcohol use: No    Alcohol/week: 0.0 standard drinks   family history includes Alcohol abuse in her brother; Gout in her father and mother; Hyperlipidemia in her mother; Thyroid disease in her brother.    ROS: negative except as noted in the HPI  Medications: Current Outpatient Medications  Medication Sig Dispense Refill  . ranitidine (ZANTAC) 150 MG tablet Take 1 tablet (150 mg total) by mouth every morning. 30 tablet 1   No current facility-administered medications for this visit.    No Known Allergies     Objective:  BP (!) 89/60   Pulse 82   Temp 98 F (36.7 C) (Oral)   Wt 104 lb (47.2 kg)   SpO2 98%   BMI 18.90 kg/m  Gen:  alert, not ill-appearing, no distress, appropriate for age, petite female HEENT: head normocephalic without obvious abnormality, conjunctiva and cornea clear,  trachea midline Pulm: Normal work of breathing, normal phonation, clear to auscultation bilaterally, no wheezes, rales or rhonchi CV: Normal rate, regular rhythm, s1 and s2 distinct, no murmurs, clicks or rubs  Neuro: alert and oriented x 3, no tremor MSK: extremities atraumatic, normal gait and station Skin: intact, no rashes on exposed skin, no jaundice, no cyanosis Psych: well-groomed, cooperative, good eye contact, euthymic mood, affect mood-congruent, speech is articulate, and thought processes clear and goal-directed    No results found for this or any previous visit (from the past 72 hour(s)). No results found.    Assessment and Plan: 37 y.o. female with   .Edwin was seen today for cough.  Diagnoses and all orders for this visit:  Nonproductive cough  Dyspepsia -     omeprazole (PRILOSEC) 20 MG capsule; Take 1 capsule (20 mg total) by mouth daily before breakfast.   Intolerance with Ranitidine. Will switch to low-dose PPI. Encouraged to eat smaller frequent meals and adhere to GERD diet    Patient education and anticipatory guidance given Patient agrees with treatment plan Follow-up in 6 months or sooner as needed if symptoms  worsen or fail to improve  Levonne Hubertharley E. Johnatha Zeidman PA-C

## 2017-11-23 ENCOUNTER — Other Ambulatory Visit: Payer: Self-pay | Admitting: Physician Assistant

## 2017-11-23 DIAGNOSIS — R058 Other specified cough: Secondary | ICD-10-CM

## 2017-11-23 DIAGNOSIS — R1013 Epigastric pain: Secondary | ICD-10-CM

## 2017-11-23 DIAGNOSIS — R05 Cough: Secondary | ICD-10-CM

## 2018-03-28 ENCOUNTER — Other Ambulatory Visit: Payer: Self-pay

## 2018-03-28 ENCOUNTER — Emergency Department
Admission: EM | Admit: 2018-03-28 | Discharge: 2018-03-28 | Disposition: A | Discharge status: Home / Self Care | Payer: 59 | Source: Home / Self Care

## 2018-03-28 DIAGNOSIS — H01134 Eczematous dermatitis of left upper eyelid: Secondary | ICD-10-CM | POA: Diagnosis not present

## 2018-03-28 MED ORDER — HYDROCORTISONE 1 % EX LOTN: 1.0000 "application " | TOPICAL_LOTION | Freq: Two times a day (BID) | CUTANEOUS | 0 refills | Status: DC

## 2018-03-28 NOTE — ED Triage Notes (Signed)
Pt for a week has been having swelling above left eye.  Had swelling this morning when she awoke, but resolved now.

## 2018-03-28 NOTE — ED Provider Notes (Signed)
[facility]    CSN: [id number] Arrival date & time: 03/28/18  8099     History   Chief Complaint Chief Complaint  Patient presents with  . Eye Problem    HPI Maria Waller is a 38 y.o. female.   HPI Maria Waller is a 38 y.o. female presenting to UC with c/o Left upper eyelid redness, swelling, itching. 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 Denies having pain to area. She tried applying a "balm" but that made the area burn. She does not wear makeup.     Past Medical History:  Diagnosis Date  . Infertility, female   . Thyroid disease    hyper, on meds x 1 year then OK    Patient Active Problem List   Diagnosis Date Noted  . History of hyperthyroidism 10/03/2017  . Intermittent palpitations 10/03/2017  . Nonproductive cough 10/03/2017  . Dyspepsia 10/03/2017    History reviewed. No pertinent surgical history.  OB History    Gravida  0   Para  0   Term      Preterm      AB      Living        SAB      TAB      Ectopic      Multiple      Live Births               Home Medications    Prior to Admission medications   Medication Sig Start Date End Date Taking? Authorizing Provider  hydrocortisone 1 % lotion Apply 1 application topically 2 (two) times daily. 03/28/18   Lurene Shadow, PA-C  Multiple Vitamin (MULTIVITAMIN WITH MINERALS) TABS tablet Take 1 tablet by mouth daily.    [provider]  omeprazole (PRILOSEC) 20 MG capsule Take 1 capsule (20 mg total) by mouth daily before breakfast. 10/31/17   Carlis Stable, PA-C    Family History Family History  Problem Relation Age of Onset  . Gout Mother   . Hyperlipidemia Mother   . Gout Father   . Alcohol abuse Brother   . Thyroid disease Brother     Social History Social History   Tobacco Use  . Smoking status: Never Smoker  . Smokeless tobacco: Never Used  Substance Use Topics  . Alcohol use: No   Alcohol/week: 0.0 standard drinks  . Drug use: No     Allergies   Patient has no known allergies.   Review of Systems Review of Systems  Constitutional: Negative for chills and fever.  HENT: Negative for congestion and rhinorrhea.   Eyes: Positive for itching. Negative for photophobia and visual disturbance.       Left upper eyelid      Physical Exam Triage Vital Signs ED Triage Vitals  Enc Vitals Group     BP      Pulse      Resp      Temp      Temp src      SpO2      Weight      Height      Head Circumference      Peak Flow      Pain Score      Pain Loc      Pain Edu?      Excl. in GC?    No data found.  Updated Vital Signs BP 101/71 (BP Location: Right  Arm)   Pulse 81   Temp 97.9 F (36.6 C) (Oral)   Resp 18   Ht 5\' 2"  (1.575 m)   Wt 106 lb (48.1 kg)   SpO2 97%   BMI 19.39 kg/m   Visual Acuity Right Eye Distance:   Left Eye Distance:   Bilateral Distance:    Right Eye Near:   Left Eye Near:    Bilateral Near:     Physical Exam Vitals signs and nursing note reviewed.  Constitutional:      Appearance: She is well-developed.  HENT:     Head: Normocephalic and atraumatic.  Eyes:     General:        Right eye: No discharge or hordeolum.        Left eye: No discharge or hordeolum.     Extraocular Movements: Extraocular movements intact.     Conjunctiva/sclera: Conjunctivae normal.     Right eye: Right conjunctiva is not injected.     Left eye: Left conjunctiva is not injected.   Neck:     Musculoskeletal: Normal range of motion.  Cardiovascular:     Rate and Rhythm: Normal rate.  Pulmonary:     Effort: Pulmonary effort is normal.  Musculoskeletal: Normal range of motion.  Skin:    General: Skin is warm and dry.  Neurological:     Mental Status: She is alert and oriented to person, place, and time.  Psychiatric:        Behavior: Behavior normal.      UC Treatments / Results  Labs (all labs ordered are listed, but only abnormal  results are displayed) Labs Reviewed - No data to display  EKG None  Radiology No results found.  Procedures Procedures (including critical care time)  Medications Ordered in UC Medications - No data to display  Initial Impression / Assessment and Plan / UC Course  I have reviewed the triage vital signs and the nursing notes.  Pertinent labs & imaging results that were available during my care of the patient were reviewed by me and considered in my medical decision making (see chart for details).     Hx and exam c/w dermatitis Reassured pt no evidence of bacterial infection Home care info provided   Final Clinical Impressions(s) / UC Diagnoses   Final diagnoses:  Eczematous dermatitis of left upper eyelid     Discharge Instructions      You may use the prescribed lotion to help with dried skin and itching.  If symptoms worsening, please stop using immediately.  Please follow up with family medicine in 1 week as needed.    ED Prescriptions    Medication Sig Dispense Auth. Provider   hydrocortisone 1 % lotion Apply 1 application topically 2 (two) times daily. 59 mL Lurene ShadowPhelps, Boysie Bonebrake O, PA-C     Controlled Substance Prescriptions [place] Controlled Substance Registry consulted? Not Applicable   Rolla Platehelps, Casie Sturgeon O, PA-C 03/28/18 1108

## 2018-03-28 NOTE — Discharge Instructions (Signed)
°  You may use the prescribed lotion to help with dried skin and itching.  If symptoms worsening, please stop using immediately.  Please follow up with family medicine in 1 week as needed.

## 2018-05-27 ENCOUNTER — Encounter: Payer: Self-pay | Admitting: Emergency Medicine

## 2018-05-27 ENCOUNTER — Telehealth: Payer: Self-pay | Admitting: Emergency Medicine

## 2018-05-27 ENCOUNTER — Emergency Department
Admission: EM | Admit: 2018-05-27 | Discharge: 2018-05-27 | Disposition: A | Discharge status: Home / Self Care | Payer: 59 | Source: Home / Self Care

## 2018-05-27 DIAGNOSIS — H9202 Otalgia, left ear: Secondary | ICD-10-CM | POA: Diagnosis not present

## 2018-05-27 NOTE — Discharge Instructions (Signed)
  You may take 500mg acetaminophen every 4-6 hours or in combination with ibuprofen 400-600mg every 6-8 hours as needed for pain, inflammation, and fever.  Be sure to well hydrated with clear liquids and get at least 8 hours of sleep at night, preferably more while sick.   Please follow up with family medicine in 1 week if needed.   

## 2018-05-27 NOTE — ED Provider Notes (Signed)
[facility]    CSN: [id number] Arrival date & time: 05/27/18  1049     History   Chief Complaint Chief Complaint  Patient presents with  . Otalgia    HPI Maria Waller is a 38 y.o. female.   HPI  Maria Waller is a 38 y.o. female presenting to UC with c/o "feeling warm" for 1 week.  She reports temp of 99*F.  Associated intermittent Left ear pain.  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 She has been taking Tylenol every 4 hours. Denies cough, congestion, sore throat, n/v/d.    Past Medical History:  Diagnosis Date  . Infertility, female   . Thyroid disease    hyper, on meds x 1 year then OK    Patient Active Problem List   Diagnosis Date Noted  . History of hyperthyroidism 10/03/2017  . Intermittent palpitations 10/03/2017  . Nonproductive cough 10/03/2017  . Dyspepsia 10/03/2017    History reviewed. No pertinent surgical history.  OB History    Gravida  0   Para  0   Term      Preterm      AB      Living        SAB      TAB      Ectopic      Multiple      Live Births               Home Medications    Prior to Admission medications   Medication Sig Start Date End Date Taking? Authorizing Provider  hydrocortisone 1 % lotion Apply 1 application topically 2 (two) times daily. 03/28/18   Lurene Shadow, PA-C  Multiple Vitamin (MULTIVITAMIN WITH MINERALS) TABS tablet Take 1 tablet by mouth daily.    [provider]  omeprazole (PRILOSEC) 20 MG capsule Take 1 capsule (20 mg total) by mouth daily before breakfast. 10/31/17   Carlis Stable, PA-C    Family History Family History  Problem Relation Age of Onset  . Gout Mother   . Hyperlipidemia Mother   . Gout Father   . Alcohol abuse Brother   . Thyroid disease Brother     Social History Social History   Tobacco Use  . Smoking status: Never Smoker  . Smokeless tobacco:  Never Used  Substance Use Topics  . Alcohol use: No    Alcohol/week: 0.0 standard drinks  . Drug use: No     Allergies   Patient has no known allergies.   Review of Systems Review of Systems  Constitutional: Negative for chills and fever.  HENT: Positive for ear pain (Left). Negative for congestion, sore throat, trouble swallowing and voice change.   Respiratory: Negative for cough and shortness of breath.   Cardiovascular: Negative for chest pain and palpitations.  Gastrointestinal: Negative for abdominal pain, diarrhea, nausea and vomiting.  Musculoskeletal: Negative for arthralgias, back pain and myalgias.  Skin: Negative for rash.     Physical Exam Triage Vital Signs ED Triage Vitals  Enc Vitals Group     BP      Pulse      Resp      Temp      Temp src      SpO2      Weight      Height      Head Circumference  Peak Flow      Pain Score      Pain Loc      Pain Edu?      Excl. in GC?    No data found.  Updated Vital Signs BP 102/74 (BP Location: Right Arm)   Pulse 80   Temp 98.3 F (36.8 C) (Oral)   Wt 105 lb (47.6 kg)   LMP 05/03/2018 (Approximate)   SpO2 100%   BMI 19.20 kg/m   Visual Acuity Right Eye Distance:   Left Eye Distance:   Bilateral Distance:    Right Eye Near:   Left Eye Near:    Bilateral Near:     Physical Exam Vitals signs and nursing note reviewed.  Constitutional:      Appearance: Normal appearance. She is well-developed.  HENT:     Head: Normocephalic and atraumatic.     Right Ear: Tympanic membrane normal.     Left Ear: Tympanic membrane normal.     Nose: Nose normal.     Right Sinus: No maxillary sinus tenderness or frontal sinus tenderness.     Left Sinus: No maxillary sinus tenderness or frontal sinus tenderness.     Mouth/Throat:     Lips: Pink.     Mouth: Mucous membranes are moist.     Pharynx: Oropharynx is clear. Uvula midline.  Neck:     Musculoskeletal: Normal range of motion.  Cardiovascular:      Rate and Rhythm: Normal rate and regular rhythm.  Pulmonary:     Effort: Pulmonary effort is normal. No respiratory distress.     Breath sounds: Normal breath sounds. No stridor. No wheezing or rhonchi.  Musculoskeletal: Normal range of motion.  Skin:    General: Skin is warm and dry.  Neurological:     Mental Status: She is alert and oriented to person, place, and time.  Psychiatric:        Behavior: Behavior normal.      UC Treatments / Results  Labs (all labs ordered are listed, but only abnormal results are displayed) Labs Reviewed - No data to display  EKG None  Radiology No results found.  Procedures Procedures (including critical care time)  Medications Ordered in UC Medications - No data to display  Initial Impression / Assessment and Plan / UC Course  I have reviewed the triage vital signs and the nursing notes.  Pertinent labs & imaging results that were available during my care of the patient were reviewed by me and considered in my medical decision making (see chart for details).     Tympanometry: Low peak height, normal ear volume   No evidence of bacterial infection at this time. Encouraged continued symptomatic tx F/u with PCP as needed.  Final Clinical Impressions(s) / UC Diagnoses   Final diagnoses:  Otalgia of left ear     Discharge Instructions      You may take 500mg  acetaminophen every 4-6 hours or in combination with ibuprofen 400-600mg  every 6-8 hours as needed for pain, inflammation, and fever.  Be sure to well hydrated with clear liquids and get at least 8 hours of sleep at night, preferably more while sick.   Please follow up with family medicine in 1 week if needed.     ED Prescriptions    None     Controlled Substance Prescriptions [place] Controlled Substance Registry consulted? Not Applicable   Rolla Plate 05/27/18 1146

## 2018-05-27 NOTE — ED Triage Notes (Signed)
Pt c/o left ear pain and "feeling warm" x1 week. 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 Tmax has been 99.

## 2018-09-10 ENCOUNTER — Telehealth: Payer: Self-pay | Admitting: Obstetrics and Gynecology

## 2018-09-10 NOTE — Telephone Encounter (Signed)
Encounter reviewed and closed.  I agree with your recommendations.  

## 2018-09-10 NOTE — Telephone Encounter (Signed)
Patient is having abnormal bleeding. °

## 2018-09-10 NOTE — Telephone Encounter (Signed)
Spoke with patient. Patient Reports irregular vaginal this month. Started menses on 7/1 lasted for a couple of days, started again on day 7, 9 and 14. Spotted some this morning, no bleeding currently. Is unsure if she is experiencing rectal bleeding, sometimes has to strain with BM. Last BM 7/14, normal. Denies hemorrhoids. Occasional mid lower abdominal pain 2/10, relieved by OTC tylenol PRN. Denies N/V, fever/chills, urinary symptoms, heavy bleeding. No contraceptive, last SA 2-3 wks ago. Has not taken UPT. Advised OV recommended for further evaluation. OV scheduled for 7/16 at 4:30pm with Dr. Quincy Simmonds. Advised patient to take UPT, return call to office if positive, if after hours will need to go directly to [facility] for further evaluation. If neg, keep OV as scheduled. Covid 19 pre-screen completed, patient states she was tested 2 wks ago because of possible Covid19 exposure, reports negative testing. Denies any symptoms. Covid 19 precautions reviewed. Advised Dr. Quincy Simmonds will review, our office will return call if any additional recommendations. Patient agreeable.   Dr. Quincy Simmonds -please review.

## 2018-09-12 ENCOUNTER — Ambulatory Visit: Payer: 59 | Admitting: Obstetrics and Gynecology

## 2018-09-12 ENCOUNTER — Other Ambulatory Visit: Payer: Self-pay

## 2018-09-12 ENCOUNTER — Encounter: Payer: Self-pay | Admitting: Obstetrics and Gynecology

## 2018-09-12 VITALS — BP 110/60 | HR 88 | Temp 97.7°F | Resp 12 | Ht 61.5 in | Wt 106.0 lb

## 2018-09-12 DIAGNOSIS — R350 Frequency of micturition: Secondary | ICD-10-CM | POA: Diagnosis not present

## 2018-09-12 DIAGNOSIS — N926 Irregular menstruation, unspecified: Secondary | ICD-10-CM | POA: Diagnosis not present

## 2018-09-12 DIAGNOSIS — K625 Hemorrhage of anus and rectum: Secondary | ICD-10-CM

## 2018-09-12 LAB — POCT URINALYSIS DIPSTICK
Bilirubin, UA: NEGATIVE
Glucose, UA: NEGATIVE
Ketones, UA: NEGATIVE
Nitrite, UA: NEGATIVE
Protein, UA: NEGATIVE
Urobilinogen, UA: 0.2 E.U./dL
pH, UA: 6 (ref 5.0–8.0)

## 2018-09-12 LAB — POCT URINE PREGNANCY: Preg Test, Ur: NEGATIVE

## 2018-09-12 MED ORDER — SULFAMETHOXAZOLE-TRIMETHOPRIM 800-160 MG PO TABS: 1.0000 | ORAL_TABLET | Freq: Two times a day (BID) | ORAL | 0 refills | Status: DC

## 2018-09-12 NOTE — Progress Notes (Signed)
GYNECOLOGY  VISIT   HPI: 38 y.o.   Married  Asian  female   G0P0 with Patient's last menstrual period was 08/28/2018.   here for irregular bleeding and possible rectal bleeding. Patient also states that she has had frequency of urination with voiding small amounts.   No painful urination today.  Drinking juice.  Used AZO and felt better.  Denies fever, nausea, vomiting, or back pain.   Menses 08/28/18.  Started bleeding again on 09/03/18 - 09/05/18.  Menses are regular and on time other than July, this year.  Cramping the first day.   States she does not strain to have a BM.  She states she has frequent BM. Not painful for BMs.  No hemorrhoids.  No rectal bleeding for 2 days.  Eating a lot of blueberries.  Has not seen a specialist and no colonoscopy.   Urine:  Mod WBC, trace RBC, cloudy UPT:  Negative.  GYNECOLOGIC HISTORY: Patient's last menstrual period was 08/28/2018. Contraception:  None Menopausal hormone therapy:  n/a Last mammogram:  n/a Last pap smear:   08/28/16 Neg:Neg HR HPV        OB History    Gravida  0   Para  0   Term      Preterm      AB      Living        SAB      TAB      Ectopic      Multiple      Live Births                 Patient Active Problem List   Diagnosis Date Noted  . History of hyperthyroidism 10/03/2017  . Intermittent palpitations 10/03/2017  . Nonproductive cough 10/03/2017  . Dyspepsia 10/03/2017    Past Medical History:  Diagnosis Date  . Infertility, female   . Thyroid disease    hyper, on meds x 1 year then OK    History reviewed. No pertinent surgical history.  Current Outpatient Medications  Medication Sig Dispense Refill  . Multiple Vitamin (MULTIVITAMIN WITH MINERALS) TABS tablet Take 1 tablet by mouth daily.     No current facility-administered medications for this visit.      ALLERGIES: Patient has no known allergies.  Family History  Problem Relation Age of Onset  . Gout Mother   .  Hyperlipidemia Mother   . Gout Father   . Alcohol abuse Brother   . Thyroid disease Brother     Social History   Socioeconomic History  . Marital status: Married    Spouse name: Not on file  . Number of children: 0  . Years of education: Not on file  . Highest education level: Not on file  Occupational History  . Not on file  Social Needs  . Financial resource strain: Not on file  . Food insecurity    Worry: Not on file    Inability: Not on file  . Transportation needs    Medical: Not on file    Non-medical: Not on file  Tobacco Use  . Smoking status: Never Smoker  . Smokeless tobacco: Never Used  Substance and Sexual Activity  . Alcohol use: No    Alcohol/week: 0.0 standard drinks  . Drug use: No  . Sexual activity: Yes    Partners: Male    Birth control/protection: None  Lifestyle  . Physical activity    Days per week: Not on file  Minutes per session: Not on file  . Stress: Not on file  Relationships  . Social Herbalist on phone: Not on file    Gets together: Not on file    Attends religious service: Not on file    Active member of club or organization: Not on file    Attends meetings of clubs or organizations: Not on file    Relationship status: Not on file  . Intimate partner violence    Fear of current or ex partner: Not on file    Emotionally abused: Not on file    Physically abused: Not on file    Forced sexual activity: Not on file  Other Topics Concern  . Not on file  Social History Narrative  . Not on file    Review of Systems  Constitutional: Negative.   HENT: Negative.   Eyes: Negative.   Respiratory: Negative.   Cardiovascular: Negative.   Gastrointestinal: Negative.   Endocrine: Negative.   Genitourinary: Positive for frequency and urgency.  Musculoskeletal: Negative.   Skin: Negative.   Allergic/Immunologic: Negative.   Neurological: Negative.   Hematological: Negative.   Psychiatric/Behavioral: Negative.      PHYSICAL EXAMINATION:    BP 110/60 (BP Location: Left Arm, Patient Position: Sitting, Cuff Size: Normal)   Pulse 88   Temp 97.7 F (36.5 C) (Temporal)   Resp 12   Ht 5' 1.5" (1.562 m)   Wt 106 lb (48.1 kg)   LMP 08/28/2018   BMI 19.70 kg/m     General appearance: alert, cooperative and appears stated age  Abdomen: soft, non-tender, no masses,  no organomegaly  Pelvic: External genitalia:  no lesions              Urethra:  normal appearing urethra with no masses, tenderness or lesions              Bartholins and Skenes: normal                 Vagina: normal appearing vagina with normal color and discharge, no lesions              Cervix: no lesions                Bimanual Exam:  Uterus:  normal size, contour, position, consistency, mobility, non-tender              Adnexa: no mass, fullness, tenderness              Rectal exam: Yes.  .  Confirms.              Anus:  normal sphincter tone, no lesions  Chaperone was present for exam.  ASSESSMENT  Frequent urination.  Abnormal urinalysis.  Recent irregular menses.  Rectal bleeding.    PLAN  Urine micro and cx sent.  Start Bactrim Ds po bid x 3 days.  Return if symptoms do not improve.  Monitor menses and call back if irregularity persists.  IFOB kit.  Stop intake of blueberrries. FU prn.   An After Visit Summary was printed and given to the patient.  15 minutes face to face time of which over 50% was spent in counseling.

## 2018-09-14 LAB — URINALYSIS, MICROSCOPIC ONLY
Bacteria, UA: NONE SEEN
Casts: NONE SEEN /lpf

## 2018-09-15 LAB — URINE CULTURE

## 2018-09-30 NOTE — Progress Notes (Signed)
38 y.o. G0P0 Married [place] female here for annual exam.    Patient was seen for potential UTI on 09/12/18 and was treated with Bactrim DS.  Her final UC was negative.   She was seen by her PCP yesterday for her routine annual exam and was prscribed Macrobid due to urinary frequency and a positive urine dip.  A UC is pending.   Menses started again on 09/23/18 and has now stopped.  She had irregular bleeding during the month of July.   Would like pregnancy.   States she tested for Covid 19 at [facility] 3 weeks ago, and it was negative.   PCP: Nelson Chimes, PA-C  Patient's last menstrual period was 09/23/2018 (exact date).           Sexually active: Yes.    The current method of family planning is none.    Exercising: No.  The patient does not participate in regular exercise at present. Smoker:  no  Health Maintenance: Pap: 08-28-16 Neg:Neg HR HPV, 05-02-13 Neg:Neg HR HPV History of abnormal Pap:  no MMG:  n/a Colonoscopy:  n/a BMD:   n/a  Result  n/a TDaP:  08-02-15 Gardasil:   no HIV: 08-28-16 NR Hep C:no Screening Labs:  PCP.   reports that she has never smoked. She has never used smokeless tobacco. She reports that she does not drink alcohol or use drugs.  Past Medical History:  Diagnosis Date  . Infertility, female   . Thyroid disease    hyper, on meds x 1 year then OK    History reviewed. No pertinent surgical history.  Current Outpatient Medications  Medication Sig Dispense Refill  . dicyclomine (BENTYL) 10 MG capsule Take 1 capsule (10 mg total) by mouth 4 (four) times daily -  before meals and at bedtime. 180 capsule 1  . Multiple Vitamin (MULTIVITAMIN WITH MINERALS) TABS tablet Take 1 tablet by mouth daily.    . nitrofurantoin, macrocrystal-monohydrate, (MACROBID) 100 MG capsule Take 1 capsule (100 mg total) by mouth 2 (two) times daily for 5 days. 10 capsule 0   No current facility-administered medications for this visit.     Family History  Problem Relation Age  of Onset  . Gout Mother   . Hyperlipidemia Mother   . Gout Father   . Alcohol abuse Brother   . Thyroid disease Brother     Review of Systems  All other systems reviewed and are negative.   Exam:   BP 108/62   Pulse 70   Temp (!) 97.4 F (36.3 C) (Temporal)   Ht 5' 1.5" (1.562 m)   Wt 103 lb 9.6 oz (47 kg)   LMP 09/23/2018 (Exact Date)   BMI 19.26 kg/m     General appearance: alert, cooperative and appears stated age Head: normocephalic, without obvious abnormality, atraumatic Neck: no adenopathy, supple, symmetrical, trachea midline and thyroid normal to inspection and palpation Lungs: clear to auscultation bilaterally Breasts: normal appearance, no masses or tenderness, No nipple retraction or dimpling, No nipple discharge or bleeding, No axillary adenopathy Heart: regular rate and rhythm Abdomen: soft, non-tender; no masses, no organomegaly Extremities: extremities normal, atraumatic, no cyanosis or edema Skin: skin color, texture, turgor normal. No rashes or lesions Lymph nodes: cervical, supraclavicular, and axillary nodes normal. Neurologic: grossly normal  Pelvic: External genitalia:  no lesions              No abnormal inguinal nodes palpated.  Urethra:  normal appearing urethra with no masses, tenderness or lesions              Bartholins and Skenes: normal                 Vagina: normal appearing vagina with normal color and discharge, no lesions              Cervix: no lesions              Pap taken: No. Bimanual Exam:  Uterus:  normal size, contour, position, consistency, mobility, non-tender              Adnexa: no mass, fullness, tenderness           Chaperone was present for exam.  Assessment:   Well woman visit with normal exam. Hx infertility. Hx hyperthyroidism.   Plan: Mammogram screening discussed. Self breast awareness reviewed. Pap and HR HPV as above. Guidelines for Calcium, Vitamin D, regular exercise program including  cardiovascular and weight bearing exercise. Gardasil vaccine if UPT negative.  Start PNV. Follow up annually and prn.   After visit summary provided.

## 2018-10-01 ENCOUNTER — Other Ambulatory Visit: Payer: Self-pay

## 2018-10-01 ENCOUNTER — Ambulatory Visit (INDEPENDENT_AMBULATORY_CARE_PROVIDER_SITE_OTHER): Payer: 59 | Admitting: Physician Assistant

## 2018-10-01 ENCOUNTER — Encounter: Payer: Self-pay | Admitting: Physician Assistant

## 2018-10-01 VITALS — BP 99/67 | HR 73 | Temp 98.2°F | Wt 106.0 lb

## 2018-10-01 DIAGNOSIS — R350 Frequency of micturition: Secondary | ICD-10-CM

## 2018-10-01 DIAGNOSIS — Z13220 Encounter for screening for lipoid disorders: Secondary | ICD-10-CM | POA: Diagnosis not present

## 2018-10-01 DIAGNOSIS — R3129 Other microscopic hematuria: Secondary | ICD-10-CM

## 2018-10-01 DIAGNOSIS — Z0000 Encounter for general adult medical examination without abnormal findings: Secondary | ICD-10-CM | POA: Diagnosis not present

## 2018-10-01 DIAGNOSIS — K589 Irritable bowel syndrome without diarrhea: Secondary | ICD-10-CM

## 2018-10-01 DIAGNOSIS — R82998 Other abnormal findings in urine: Secondary | ICD-10-CM | POA: Diagnosis not present

## 2018-10-01 DIAGNOSIS — Z131 Encounter for screening for diabetes mellitus: Secondary | ICD-10-CM

## 2018-10-01 DIAGNOSIS — E559 Vitamin D deficiency, unspecified: Secondary | ICD-10-CM | POA: Diagnosis not present

## 2018-10-01 DIAGNOSIS — Z8639 Personal history of other endocrine, nutritional and metabolic disease: Secondary | ICD-10-CM | POA: Diagnosis not present

## 2018-10-01 DIAGNOSIS — Z130 Encounter for screening for diseases of the blood and blood-forming organs and certain disorders involving the immune mechanism: Secondary | ICD-10-CM

## 2018-10-01 LAB — POCT URINALYSIS DIPSTICK
Bilirubin, UA: NEGATIVE
Glucose, UA: NEGATIVE
Ketones, UA: NEGATIVE
Nitrite, UA: NEGATIVE
Protein, UA: NEGATIVE
Spec Grav, UA: 1.015 (ref 1.010–1.025)
Urobilinogen, UA: 0.2 E.U./dL
pH, UA: 5.5 (ref 5.0–8.0)

## 2018-10-01 MED ORDER — NITROFURANTOIN MONOHYD MACRO 100 MG PO CAPS: 100.0000 mg | ORAL_CAPSULE | Freq: Two times a day (BID) | ORAL | 0 refills | Status: AC

## 2018-10-01 MED ORDER — DICYCLOMINE HCL 10 MG PO CAPS: 10.0000 mg | ORAL_CAPSULE | Freq: Three times a day (TID) | ORAL | 1 refills | Status: DC

## 2018-10-01 NOTE — Progress Notes (Signed)
HPI:                                                                Maria Waller is a 38 y.o. female who presents to [facility] today for annual physical exam  Current Concerns include: urinary frequency and frequent bowel movements  Patient reports urinary frequency/urgency "for a long time." Describes voiding several times per hour with only a small quantity of urine coming out each time. Denies dysuria, hematuria, leakage. No fever, chills, malaise, flank pain, or suprapubic pressure. No hx of pyelonephritis. No hx of urologic procedures/surgeries.  She saw her Gynecologist on 09/12/18 for this problem. She was prescribed Bacrtim DS, but states she did not start the antibiotic and still has it at home. Symptoms are unchanged since that visit.  She also endorses frequent BM 3-4 times per day. Occasionally associated with abdominal pain. Admits to eats a lot of fiber and yogurt. Denies fecal urgency or tenesmus. She has had hematochezia in the past (more than 1 month ago); she states this occurs when she is constipated and straining. Denies constitutional symptoms. No prior hx of colonoscopy.  She has tried avoiding spicy foods, which she states is helpful.   Depression screen [facility] 2/9 10/01/2018 10/03/2017  Decreased Interest 0 0  Down, Depressed, Hopeless 0 0  PHQ - 2 Score 0 0   Fall Risk  10/01/2018  Falls in the past year? 0  Number falls in past yr: 0  Injury with Fall? 0  Follow up Falls evaluation completed     Health Maintenance Health Maintenance  Topic Date Due  . INFLUENZA VACCINE  11/01/2018 (Originally 09/28/2018)  . PAP SMEAR-Modifier  08/29/2019  . TETANUS/TDAP  08/01/2025  . HIV Screening  Completed    Past Medical History:  Diagnosis Date  . Infertility, female   . Thyroid disease    hyper, on meds x 1 year then OK  . Vitamin D insufficiency 10/03/2018   No past surgical history on file. Social History    Tobacco Use  . Smoking status: Never Smoker  . Smokeless tobacco: Never Used  Substance Use Topics  . Alcohol use: No    Alcohol/week: 0.0 standard drinks   family history includes Alcohol abuse in her brother; Gout in her father and mother; Hyperlipidemia in her mother; Thyroid disease in her brother.  ROS: negative except as noted in the HPI  Medications: Current Outpatient Medications  Medication Sig Dispense Refill  . Multiple Vitamin (MULTIVITAMIN WITH MINERALS) TABS tablet Take 1 tablet by mouth daily.    Marland Kitchen dicyclomine (BENTYL) 10 MG capsule Take 1 capsule (10 mg total) by mouth 4 (four) times daily -  before meals and at bedtime. 180 capsule 1  . nitrofurantoin, macrocrystal-monohydrate, (MACROBID) 100 MG capsule Take 1 capsule (100 mg total) by mouth 2 (two) times daily for 5 days. 10 capsule 0   No current facility-administered medications for this visit.    No Known Allergies     Objective:  BP 99/67   Pulse 73   Temp 98.2 F (36.8 C) (Oral)   Wt 106 lb (48.1 kg)   LMP 09/23/2018 (Exact Date)   BMI 19.70 kg/m  General Appearance:  Alert, cooperative, no  distress, appropriate for age                            Head:  Normocephalic, without obvious abnormality                             Eyes:  PERRL, EOM's intact, no scleral injection or icterus                             Ears:  TM pearly gray color and semitransparent, external ear canals normal, both ears                            Nose:  Nares symmetrical, mucosa pink                          Throat:  Lips, tongue, and mucosa are moist, pink, and intact; oropharynx clear, uvula midline; good dentition                             Neck:  Supple; symmetrical, trachea midline, no adenopathy; thyroid: no enlargement, symmetric, no tenderness/mass/nodules                             Back:  Symmetrical, no curvature, ROM normal               Chest/Breast: deferred                           Lungs:  Clear to  auscultation bilaterally, respirations unlabored                             Heart:  normal rate & regular rhythm, S1 and S2 normal, no murmurs, rubs, or gallops                     Abdomen:  Soft, non-tender, no mass or organomegaly, negative Murphy's sign              Genitourinary:  deferred         Musculoskeletal:  Tone and strength strong and symmetrical, all extremities; no joint pain or edema, normal gait and station                                   Lymphatic:  No adenopathy             Skin/Hair/Nails:  Skin warm, dry and intact, no rashes or abnormal dyspigmentation on limited exam                   Neurologic:  Alert and oriented x3, no cranial nerve deficits, sensation grossly intact, normal gait and station, no tremor Psych: well-groomed, cooperative, good eye contact, euthymic mood, affect mood-congruent, speech is articulate, and thought processes clear and goal-directed  Recent Results (from the past 2160 hour(s))  POCT urine pregnancy     Status: Normal   Collection Time: 09/12/18  4:57 PM  Result Value Ref Range   Preg Test, Ur Negative Negative  POCT Urinalysis  Dipstick     Status: Abnormal   Collection Time: 09/12/18  4:57 PM  Result Value Ref Range   Color, UA yellow    Clarity, UA cloudy    Glucose, UA Negative Negative   Bilirubin, UA n    Ketones, UA n    Spec Grav, UA     Blood, UA trace    pH, UA 6.0 5.0 - 8.0   Protein, UA Negative Negative   Urobilinogen, UA 0.2 0.2 or 1.0 E.U./dL   Nitrite, UA n    Leukocytes, UA Moderate (2+) (A) Negative   Appearance     Odor    Urine Culture     Status: None   Collection Time: 09/12/18  5:00 PM   Specimen: Urine, Clean Catch   UR  Result Value Ref Range   Urine Culture, Routine Final report    Organism ID, Bacteria Comment     Comment: Mixed urogenital flora Less than 10,000 colonies/mL   Urine Microscopic     Status: None   Collection Time: 09/12/18  5:00 PM  Result Value Ref Range   WBC, UA 0-5 0 - 5  /hpf   RBC 0-2 0 - 2 /hpf   Epithelial Cells (non renal) 0-10 0 - 10 /hpf   Casts None seen None seen /lpf   Bacteria, UA None seen None seen/Few  Urinalysis w microscopic + reflex cultur     Status: Abnormal   Collection Time: 10/01/18  8:54 AM   Specimen: Urine  Result Value Ref Range   Color, Urine YELLOW YELLOW   APPearance CLEAR CLEAR   Specific Gravity, Urine 1.010 1.001 - 1.03   pH 5.5 5.0 - 8.0   Glucose, UA NEGATIVE NEGATIVE   Bilirubin Urine NEGATIVE NEGATIVE   Ketones, ur NEGATIVE NEGATIVE   Hgb urine dipstick NEGATIVE NEGATIVE   Protein, ur NEGATIVE NEGATIVE   Nitrites, Initial NEGATIVE NEGATIVE   Leukocyte Esterase 3+ (A) NEGATIVE   WBC, UA 10-20 (A) 0 - 5 /HPF   RBC / HPF 0-2 0 - 2 /HPF   Squamous Epithelial / LPF 0-5 < OR = 5 /HPF   Bacteria, UA NONE SEEN NONE SEEN /HPF   Hyaline Cast NONE SEEN NONE SEEN /LPF  REFLEXIVE URINE CULTURE     Status: None   Collection Time: 10/01/18  8:54 AM  Result Value Ref Range   REFLEXIVE URINE CULTURE CULTURE INDICATED - RESULTS TO FOLLOW   Urine Culture     Status: None   Collection Time: 10/01/18  8:54 AM  Result Value Ref Range   MICRO NUMBER: [id number]     Comment: Result canceled by the ancillary.   SPECIMEN QUALITY: CANCELED     Comment: Result canceled by the ancillary.   Sample Source CANCELED     Comment: Result canceled by the ancillary.   STATUS: CANCELED     Comment: Result canceled by the ancillary.   Result: CANCELED     Comment: Result canceled by the ancillary.  POCT Urinalysis Dipstick     Status: Abnormal   Collection Time: 10/01/18  9:01 AM  Result Value Ref Range   Color, UA yellow    Clarity, UA slightly cloudy    Glucose, UA Negative Negative   Bilirubin, UA negative    Ketones, UA negative    Spec Grav, UA 1.015 1.010 - 1.025   Blood, UA trace    pH, UA 5.5 5.0 - 8.0   Protein, UA Negative Negative  Urobilinogen, UA 0.2 0.2 or 1.0 E.U./dL   Nitrite, UA negative    Leukocytes, UA Small (1+)  (A) Negative   Appearance     Odor    Urine Culture     Status: None   Collection Time: 10/01/18  9:03 AM   Specimen: Urine  Result Value Ref Range   MICRO NUMBER: [id number]    SPECIMEN QUALITY: Adequate    Sample Source URINE    STATUS: FINAL    ISOLATE 1:      Single organism less than 10,000 CFU/mL isolated. These organisms, commonly found on external and internal genitalia, are considered colonizers. No further testing performed.  CBC     Status: None   Collection Time: 10/01/18  9:22 AM  Result Value Ref Range   WBC 6.7 3.8 - 10.8 Thousand/uL   RBC 4.66 3.80 - 5.10 Million/uL   Hemoglobin 12.6 11.7 - 15.5 g/dL   HCT 54.0 98.1 - 19.1 %   MCV 81.5 80.0 - 100.0 fL   MCH 27.0 27.0 - 33.0 pg   MCHC 33.2 32.0 - 36.0 g/dL   RDW 47.8 29.5 - 62.1 %   Platelets 385 140 - 400 Thousand/uL   MPV 10.1 7.5 - 12.5 fL  COMPLETE METABOLIC PANEL WITH GFR     Status: None   Collection Time: 10/01/18  9:22 AM  Result Value Ref Range   Glucose, Bld 73 65 - 99 mg/dL    Comment: .            Fasting reference interval .    BUN 11 7 - 25 mg/dL   Creat 3.08 6.57 - 8.46 mg/dL   GFR, Est Non African American 103 > OR = 60 mL/min/1.38m2   GFR, Est African American 119 > OR = 60 mL/min/1.73m2   BUN/Creatinine Ratio NOT APPLICABLE 6 - 22 (calc)   Sodium 141 135 - 146 mmol/L   Potassium 4.1 3.5 - 5.3 mmol/L   Chloride 105 98 - 110 mmol/L   CO2 32 20 - 32 mmol/L   Calcium 9.7 8.6 - 10.2 mg/dL   Total Protein 7.6 6.1 - 8.1 g/dL   Albumin 4.6 3.6 - 5.1 g/dL   Globulin 3.0 1.9 - 3.7 g/dL (calc)   AG Ratio 1.5 1.0 - 2.5 (calc)   Total Bilirubin 0.7 0.2 - 1.2 mg/dL   Alkaline phosphatase (APISO) 40 31 - 125 U/L   AST 19 10 - 30 U/L   ALT 15 6 - 29 U/L  Lipid Profile     Status: None   Collection Time: 10/01/18  9:22 AM  Result Value Ref Range   Cholesterol 181 <200 mg/dL   HDL 67 > OR = 50 mg/dL   Triglycerides 962 <952 mg/dL   LDL Cholesterol (Calc) 93 mg/dL (calc)    Comment: Reference  range: <100 . Desirable range <100 mg/dL for primary prevention;   <70 mg/dL for patients with CHD or diabetic patients  with > or = 2 CHD risk factors. Marland Kitchen LDL-C is now calculated using the Martin-Hopkins  calculation, which is a validated novel method providing  better accuracy than the Friedewald equation in the  estimation of LDL-C.  Horald Pollen et al. Lenox Ahr. 8413;244(01): 2061-2068  (http://education.[facility].com/faq/FAQ164)    Total CHOL/HDL Ratio 2.7 <5.0 (calc)   Non-HDL Cholesterol (Calc) 114 <130 mg/dL (calc)    Comment: For patients with diabetes plus 1 major ASCVD risk  factor, treating to a non-HDL-C goal of <100 mg/dL  (LDL-C  of <70 mg/dL) is considered a therapeutic  option.   VITAMIN D 25 Hydroxy (Vit-D Deficiency, Fractures)     Status: Abnormal   Collection Time: 10/01/18  9:22 AM  Result Value Ref Range   Vit D, 25-Hydroxy 25 (L) 30 - 100 ng/mL    Comment: Vitamin D Status         25-OH Vitamin D: . Deficiency:                    <20 ng/mL Insufficiency:             20 - 29 ng/mL Optimal:                 > or = 30 ng/mL . For 25-OH Vitamin D testing on patients on  D2-supplementation and patients for whom quantitation  of D2 and D3 fractions is required, the [facility](TM) 25-OH VIT D, (D2,D3), LC/MS/MS is recommended: order  code [id number] (patients >3249yrs). See Note 1 . Note 1 . For additional information, please refer to  http://education.[facility].com/faq/FAQ199  (This link is being provided for informational/ educational purposes only.)   TSH + free T4     Status: None   Collection Time: 10/01/18  9:22 AM  Result Value Ref Range   TSH W/REFLEX TO FT4 1.03 mIU/L    Comment:           Reference Range .           > or = 20 Years  0.40-4.50 .                Pregnancy Ranges           First trimester    0.26-2.66           Second trimester   0.55-2.73           Third trimester    0.43-2.91   POCT urine pregnancy     Status: Normal    Collection Time: 10/02/18 10:38 AM  Result Value Ref Range   Preg Test, Ur Negative Negative  Fecal occult blood, imunochemical     Status: None   Collection Time: 10/03/18 12:00 AM  Result Value Ref Range   Fecal Occult Bld Negative Negative  Specimen status report     Status: None   Collection Time: 10/03/18 12:00 AM  Result Value Ref Range   specimen status report Comment     Comment: Please note Please note The date and/or time of collection was not indicated on the requisition as required by state and federal law.  The date of receipt of the specimen was used as the collection date if not supplied.      Assessment and Plan: 38 y.o. female with   .[name] was seen today for annual exam.  Diagnoses and all orders for this visit:  Encounter for annual physical exam -     CBC -     COMPLETE METABOLIC PANEL WITH GFR -     Lipid Profile -     VITAMIN D 25 Hydroxy (Vit-D Deficiency, Fractures) -     TSH + free T4  History of hyperthyroidism -     TSH + free T4  Screening for lipid disorders -     Lipid Profile  Vitamin D insufficiency -     VITAMIN D 25 Hydroxy (Vit-D Deficiency, Fractures)  Screening for blood disease -     CBC -     COMPLETE METABOLIC PANEL WITH GFR  Screening for diabetes mellitus -     COMPLETE METABOLIC PANEL WITH GFR  Urinary frequency -     Urinalysis w microscopic + reflex cultur -     nitrofurantoin, macrocrystal-monohydrate, (MACROBID) 100 MG capsule; Take 1 capsule (100 mg total) by mouth 2 (two) times daily for 5 days.  Irritable bowel syndrome, unspecified type -     dicyclomine (BENTYL) 10 MG capsule; Take 1 capsule (10 mg total) by mouth 4 (four) times daily -  before meals and at bedtime.  Urine leukocytes -     nitrofurantoin, macrocrystal-monohydrate, (MACROBID) 100 MG capsule; Take 1 capsule (100 mg total) by mouth 2 (two) times daily for 5 days. -     POCT Urinalysis Dipstick -     Urine Culture  Microscopic hematuria -      nitrofurantoin, macrocrystal-monohydrate, (MACROBID) 100 MG capsule; Take 1 capsule (100 mg total) by mouth 2 (two) times daily for 5 days. -     POCT Urinalysis Dipstick -     Urine Culture  Other orders -     REFLEXIVE URINE CULTURE -     Urine Culture  - Personally reviewed PMH, PSH, PFH, medications, allergies, HM - Age-appropriate cancer screening: Pap UTD - Tdap UTD - PHQ2 negative - Fall screen negative   Urinary frequency UA positive for trace blood and leuks Urine culture pending Treating empirically for uncomplicated cystitis  IBS Food diary Reduce fiber consumption Counseled on IBS eating plan Trial of Bentryl QID prn or scheduled before meals  Patient education and anticipatory guidance given Patient agrees with treatment plan Follow-up in 1 month for urinary frequency/IBS or sooner as needed  Levonne Hubert PA-C

## 2018-10-01 NOTE — Patient Instructions (Addendum)
Diet for Irritable Bowel Syndrome When you have irritable bowel syndrome (IBS), it is very important to eat the foods and follow the eating habits that are best for your condition. IBS may cause various symptoms such as pain in the abdomen, constipation, or diarrhea. Choosing the right foods can help to ease the discomfort from these symptoms. Work with your health care provider and diet and nutrition specialist (dietitian) to find the eating plan that will help to control your symptoms. What are tips for following this plan?      Keep a food diary. This will help you identify foods that cause symptoms. Write down: ? What you eat and when you eat it. ? What symptoms you have. ? When symptoms occur in relation to your meals, such as "pain in abdomen 2 hours after dinner."  Eat your meals slowly and in a relaxed setting.  Aim to eat 5-6 small meals per day. Do not skip meals.  Drink enough fluid to keep your urine pale yellow.  Ask your health care provider if you should take an over-the-counter probiotic to help restore healthy bacteria in your gut (digestive tract). ? Probiotics are foods that contain good bacteria and yeasts.  Your dietitian may have specific dietary recommendations for you based on your symptoms. He or she may recommend that you: ? Avoid foods that cause symptoms. Talk with your dietitian about other ways to get the same nutrients that are in those problem foods. ? Avoid foods with gluten. Gluten is a protein that is found in rye, wheat, and barley. ? Eat more foods that contain soluble fiber. Examples of foods with high soluble fiber include oats, seeds, and certain fruits and vegetables. Take a fiber supplement if directed by your dietitian. ? Reduce or avoid certain foods called FODMAPs. These are foods that contain carbohydrates that are hard to digest. Ask your doctor which foods contain these carbohydrates. What foods are not recommended? The following are some  foods and drinks that may make your symptoms worse:  Fatty foods, such as french fries.  Foods that contain gluten, such as pasta and cereal.  Dairy products, such as milk, cheese, and ice cream.  Chocolate.  Alcohol.  Products with caffeine, such as coffee.  Carbonated drinks, such as soda.  Foods that are high in FODMAPs. These include certain fruits and vegetables.  Products with sweeteners such as honey, high fructose corn syrup, sorbitol, and mannitol. The items listed above may not be a complete list of foods and beverages you should avoid. Contact a dietitian for more information. What foods are good sources of fiber? Your health care provider or dietitian may recommend that you eat more foods that contain fiber. Fiber can help to reduce constipation and other IBS symptoms. Add foods with fiber to your diet a little at a time so your body can get used to them. Too much fiber at one time might cause gas and swelling of your abdomen. The following are some foods that are good sources of fiber:  Berries, such as raspberries, strawberries, and blueberries.  Tomatoes.  Carrots.  Brown rice.  Oats.  Seeds, such as chia and pumpkin seeds. The items listed above may not be a complete list of recommended sources of fiber. Contact your dietitian for more options. Where to find more information  International Foundation for Functional Gastrointestinal Disorders: www.iffgd.[employer] of Diabetes and Digestive and Kidney Diseases: [employer] Summary  When you have irritable bowel syndrome (IBS), it is  very important to eat the foods and follow the eating habits that are best for your condition.  IBS may cause various symptoms such as pain in the abdomen, constipation, or diarrhea.  Choosing the right foods can help to ease the discomfort that comes from symptoms.  Keep a food diary. This will help you identify foods that cause symptoms.  Your health  care provider or diet and nutrition specialist (dietitian) may recommend that you eat more foods that contain fiber. This information is not intended to replace advice given to you by your health care provider. Make sure you discuss any questions you have with your health care provider. Document Released: 05/06/2003 Document Revised: 06/05/2018 Document Reviewed: 10/17/2016 Elsevier Patient Education  2020 [place] 11-69 Years Old, Female Preventive care refers to visits with your health care provider and lifestyle choices that can promote health and wellness. This includes:  A yearly physical exam. This may also be called an annual well check.  Regular dental visits and eye exams.  Immunizations.  Screening for certain conditions.  Healthy lifestyle choices, such as eating a healthy diet, getting regular exercise, not using drugs or products that contain nicotine and tobacco, and limiting alcohol use. What can I expect for my preventive care visit? Physical exam Your health care provider will check your:  Height and weight. This may be used to calculate body mass index (BMI), which tells if you are at a healthy weight.  Heart rate and blood pressure.  Skin for abnormal spots. Counseling Your health care provider may ask you questions about your:  Alcohol, tobacco, and drug use.  Emotional well-being.  Home and relationship well-being.  Sexual activity.  Eating habits.  Work and work Statistician.  Method of birth control.  Menstrual cycle.  Pregnancy history. What immunizations do I need?  Influenza (flu) vaccine  This is recommended every year. Tetanus, diphtheria, and pertussis (Tdap) vaccine  You may need a Td booster every 10 years. Varicella (chickenpox) vaccine  You may need this if you have not been vaccinated. Human papillomavirus (HPV) vaccine  If recommended by your health care provider, you may need three doses over 6  months. Measles, mumps, and rubella (MMR) vaccine  You may need at least one dose of MMR. You may also need a second dose. Meningococcal conjugate (MenACWY) vaccine  One dose is recommended if you are age 79-21 years and a first-year college student living in a residence hall, or if you have one of several medical conditions. You may also need additional booster doses. Pneumococcal conjugate (PCV13) vaccine  You may need this if you have certain conditions and were not previously vaccinated. Pneumococcal polysaccharide (PPSV23) vaccine  You may need one or two doses if you smoke cigarettes or if you have certain conditions. Hepatitis A vaccine  You may need this if you have certain conditions or if you travel or work in places where you may be exposed to hepatitis A. Hepatitis B vaccine  You may need this if you have certain conditions or if you travel or work in places where you may be exposed to hepatitis B. Haemophilus influenzae type b (Hib) vaccine  You may need this if you have certain conditions. You may receive vaccines as individual doses or as more than one vaccine together in one shot (combination vaccines). Talk with your health care provider about the risks and benefits of combination vaccines. What tests do I need?  Blood tests  Lipid and  cholesterol levels. These may be checked every 5 years starting at age 67.  Hepatitis C test.  Hepatitis B test. Screening  Diabetes screening. This is done by checking your blood sugar (glucose) after you have not eaten for a while (fasting).  Sexually transmitted disease (STD) testing.  BRCA-related cancer screening. This may be done if you have a family history of breast, ovarian, tubal, or peritoneal cancers.  Pelvic exam and Pap test. This may be done every 3 years starting at age 85. Starting at age 14, this may be done every 5 years if you have a Pap test in combination with an HPV test. Talk with your health care  provider about your test results, treatment options, and if necessary, the need for more tests. Follow these instructions at home: Eating and drinking   Eat a diet that includes fresh fruits and vegetables, whole grains, lean protein, and low-fat dairy.  Take vitamin and mineral supplements as recommended by your health care provider.  Do not drink alcohol if: ? Your health care provider tells you not to drink. ? You are pregnant, may be pregnant, or are planning to become pregnant.  If you drink alcohol: ? Limit how much you have to 0-1 drink a day. ? Be aware of how much alcohol is in your drink. In the [place] one drink equals one 12 oz bottle of beer (355 mL), one 5 oz glass of wine (148 mL), or one 1 oz glass of hard liquor (44 mL). Lifestyle  Take daily care of your teeth and gums.  Stay active. Exercise for at least 30 minutes on 5 or more days each week.  Do not use any products that contain nicotine or tobacco, such as cigarettes, e-cigarettes, and chewing tobacco. If you need help quitting, ask your health care provider.  If you are sexually active, practice safe sex. Use a condom or other form of birth control (contraception) in order to prevent pregnancy and STIs (sexually transmitted infections). If you plan to become pregnant, see your health care provider for a preconception visit. What's next?  Visit your health care provider once a year for a well check visit.  Ask your health care provider how often you should have your eyes and teeth checked.  Stay up to date on all vaccines. This information is not intended to replace advice given to you by your health care provider. Make sure you discuss any questions you have with your health care provider. Document Released: 04/11/2001 Document Revised: 10/25/2017 Document Reviewed: 10/25/2017 Elsevier Patient Education  2020 [employer].

## 2018-10-02 ENCOUNTER — Encounter: Payer: Self-pay | Admitting: Obstetrics and Gynecology

## 2018-10-02 ENCOUNTER — Other Ambulatory Visit: Payer: Self-pay

## 2018-10-02 ENCOUNTER — Ambulatory Visit: Payer: 59 | Admitting: Obstetrics and Gynecology

## 2018-10-02 VITALS — BP 108/62 | HR 70 | Temp 97.4°F | Ht 61.5 in | Wt 103.6 lb

## 2018-10-02 DIAGNOSIS — Z23 Encounter for immunization: Secondary | ICD-10-CM

## 2018-10-02 DIAGNOSIS — Z01419 Encounter for gynecological examination (general) (routine) without abnormal findings: Secondary | ICD-10-CM | POA: Diagnosis not present

## 2018-10-02 LAB — LIPID PANEL
Cholesterol: 181 mg/dL (ref <200)
HDL: 67 mg/dL (ref >=50)
LDL Cholesterol (Calc): 93 mg/dL (calc)
Non-HDL Cholesterol (Calc): 114 mg/dL (calc) (ref <130)
Total CHOL/HDL Ratio: 2.7 (calc) (ref <5.0)
Triglycerides: 112 mg/dL (ref <150)

## 2018-10-02 LAB — COMPLETE METABOLIC PANEL WITHOUT GFR
AG Ratio: 1.5 (calc) (ref 1.0–2.5)
ALT: 15 U/L (ref 6–29)
AST: 19 U/L (ref 10–30)
Albumin: 4.6 g/dL (ref 3.6–5.1)
Alkaline phosphatase (APISO): 40 U/L (ref 31–125)
BUN: 11 mg/dL (ref 7–25)
CO2: 32 mmol/L (ref 20–32)
Calcium: 9.7 mg/dL (ref 8.6–10.2)
Chloride: 105 mmol/L (ref 98–110)
Creat: 0.75 mg/dL (ref 0.50–1.10)
GFR, Est African American: 119 mL/min/1.73m2
GFR, Est Non African American: 103 mL/min/1.73m2
Globulin: 3 g/dL (ref 1.9–3.7)
Glucose, Bld: 73 mg/dL (ref 65–99)
Potassium: 4.1 mmol/L (ref 3.5–5.3)
Sodium: 141 mmol/L (ref 135–146)
Total Bilirubin: 0.7 mg/dL (ref 0.2–1.2)
Total Protein: 7.6 g/dL (ref 6.1–8.1)

## 2018-10-02 LAB — CBC
HCT: 38 % (ref 35.0–45.0)
Hemoglobin: 12.6 g/dL (ref 11.7–15.5)
MCH: 27 pg (ref 27.0–33.0)
MCHC: 33.2 g/dL (ref 32.0–36.0)
MCV: 81.5 fL (ref 80.0–100.0)
MPV: 10.1 fL (ref 7.5–12.5)
Platelets: 385 Thousand/uL (ref 140–400)
RBC: 4.66 Million/uL (ref 3.80–5.10)
RDW: 12 % (ref 11.0–15.0)
WBC: 6.7 Thousand/uL (ref 3.8–10.8)

## 2018-10-02 LAB — VITAMIN D 25 HYDROXY (VIT D DEFICIENCY, FRACTURES): Vit D, 25-Hydroxy: 25 ng/mL — ABNORMAL LOW (ref 30–100)

## 2018-10-02 LAB — POCT URINE PREGNANCY: Preg Test, Ur: NEGATIVE

## 2018-10-02 LAB — TSH+FREE T4: TSH W/REFLEX TO FT4: 1.03 m[IU]/L

## 2018-10-02 NOTE — Patient Instructions (Signed)

## 2018-10-03 ENCOUNTER — Encounter: Payer: Self-pay | Admitting: Physician Assistant

## 2018-10-03 DIAGNOSIS — E559 Vitamin D deficiency, unspecified: Secondary | ICD-10-CM

## 2018-10-03 HISTORY — DX: Vitamin D deficiency, unspecified: E55.9

## 2018-10-03 LAB — URINE CULTURE
MICRO NUMBER:: 735074
SPECIMEN QUALITY:: ADEQUATE

## 2018-10-04 LAB — URINE CULTURE

## 2018-10-04 LAB — URINALYSIS W MICROSCOPIC + REFLEX CULTURE
Bacteria, UA: NONE SEEN /HPF
Bilirubin Urine: NEGATIVE
Glucose, UA: NEGATIVE
Hgb urine dipstick: NEGATIVE
Hyaline Cast: NONE SEEN /LPF
Ketones, ur: NEGATIVE
Nitrites, Initial: NEGATIVE
Protein, ur: NEGATIVE
Specific Gravity, Urine: 1.01 (ref 1.001–1.03)
pH: 5.5 (ref 5.0–8.0)

## 2018-10-04 LAB — FECAL OCCULT BLOOD, IMMUNOCHEMICAL: Fecal Occult Bld: NEGATIVE

## 2018-10-04 LAB — CULTURE INDICATED

## 2018-10-04 LAB — SPECIMEN STATUS REPORT

## 2018-11-24 IMAGING — US US THYROID
1 series · 14 of 25 positions shown · non-contrast
Comparison: None.

CLINICAL DATA: Hyperthyroid.

EXAM:
THYROID ULTRASOUND
TECHNIQUE: Ultrasound examination of the thyroid gland and adjacent soft
tissues was performed.

[Series 1: us thyroid · 0.06mm/px · 14 of 37 slices shown]
[im 1/37]
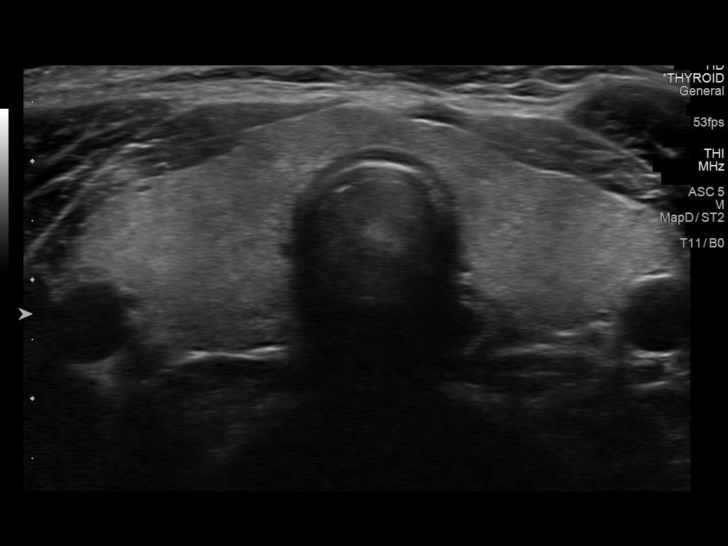
[im 4/37]
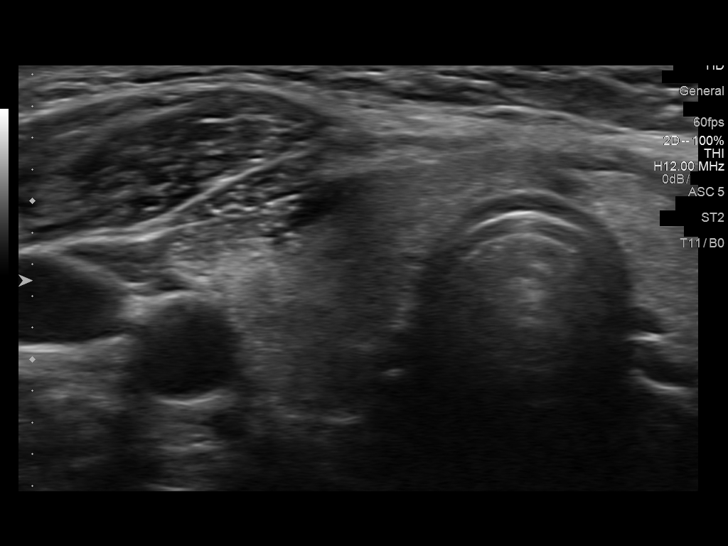
[im 7/37]
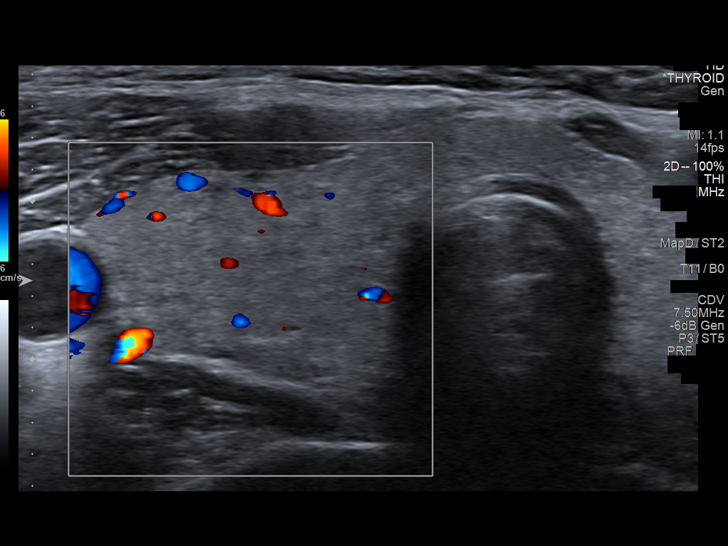
[im 10/37]
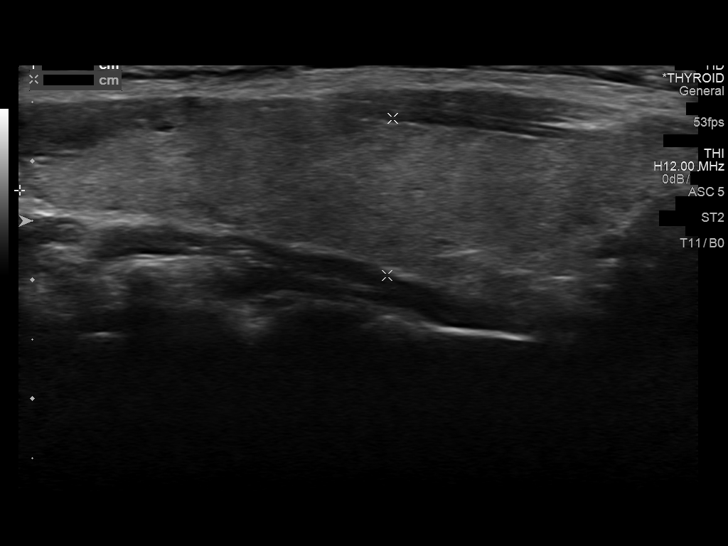
[im 13/37]
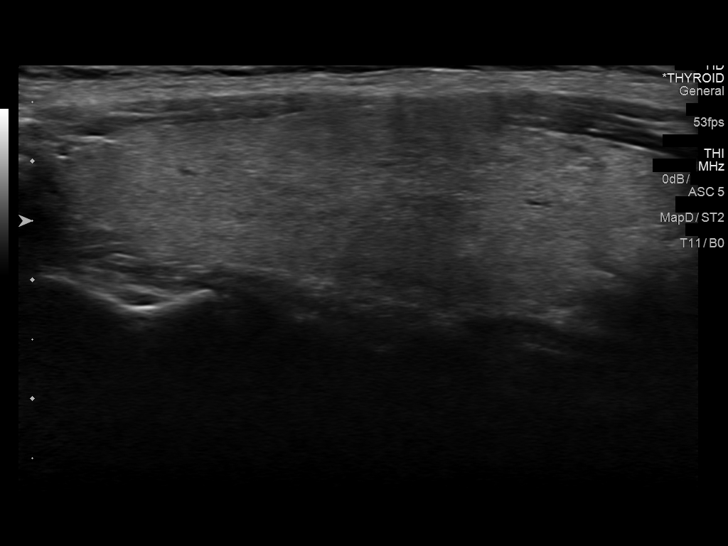
[im 14/37]
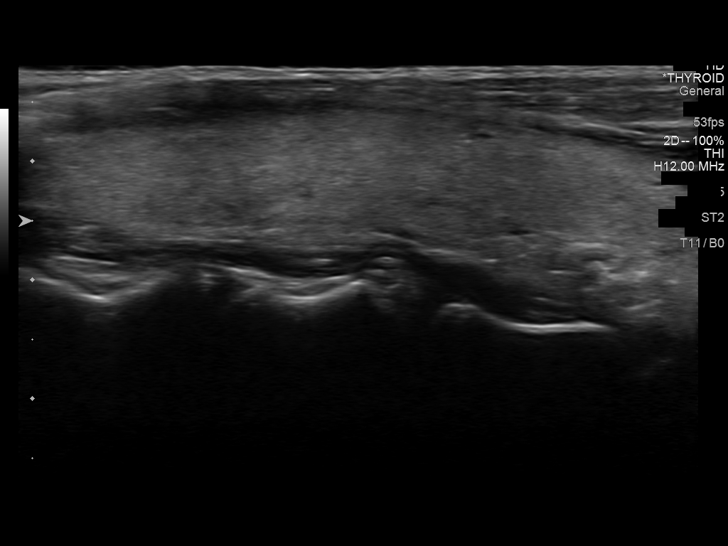
[im 17/37]
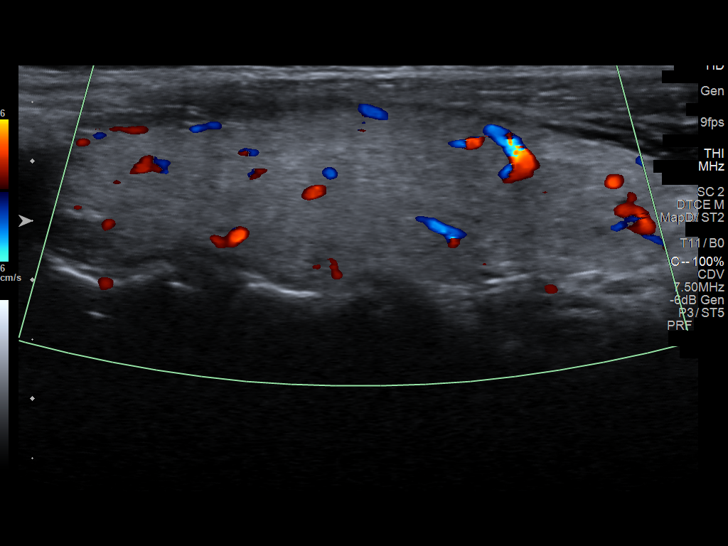
[im 20/37]
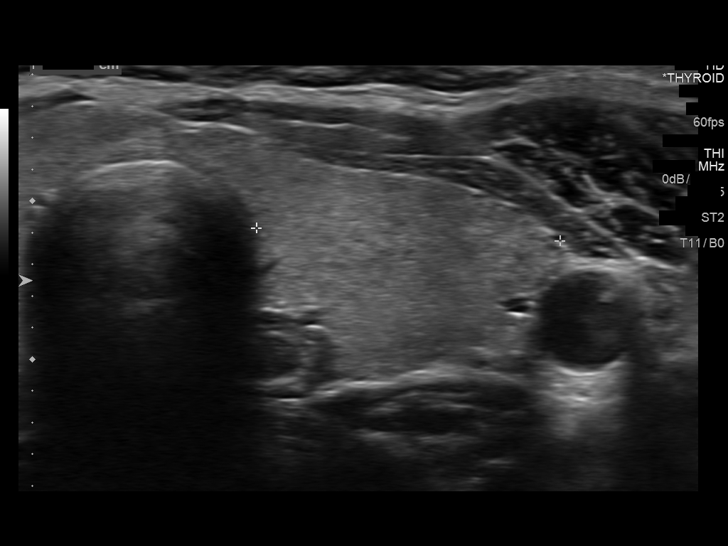
[im 23/37]
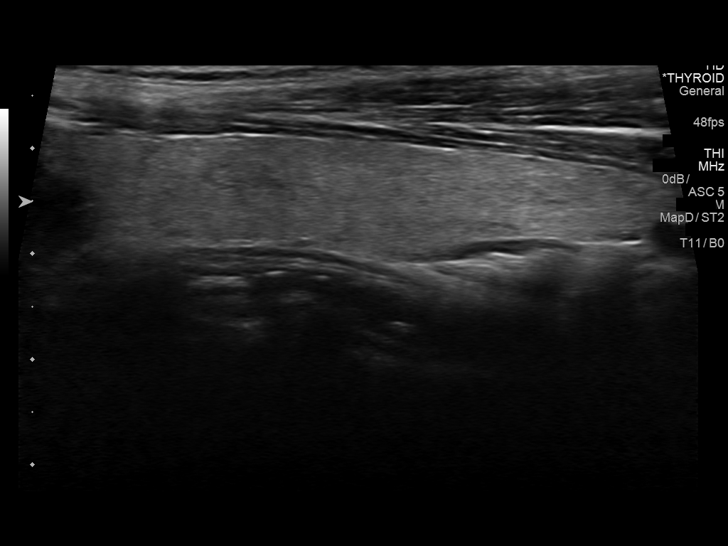
[im 25/37]
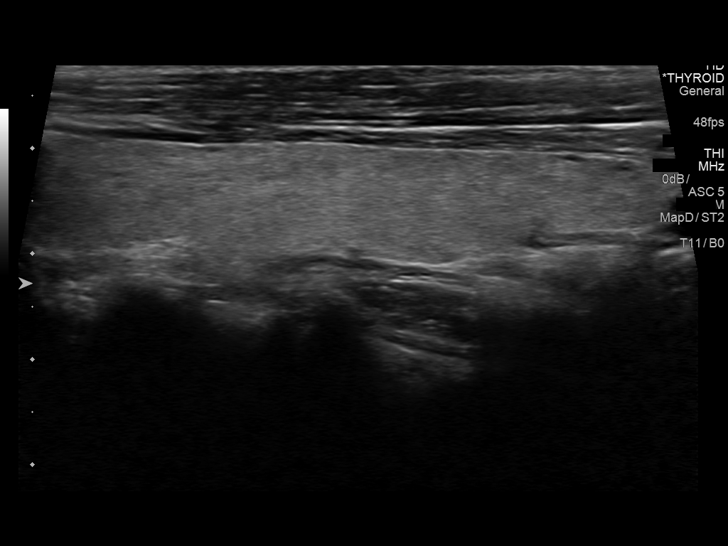
[im 28/37]
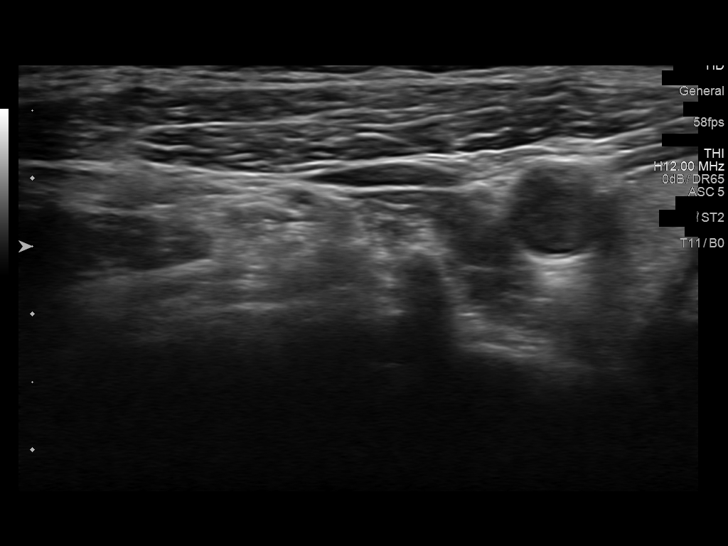
[im 31/37]
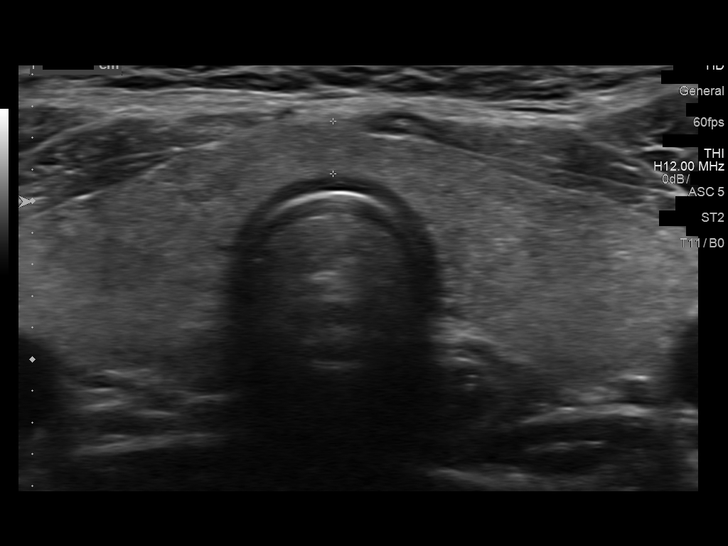
[im 34/37]
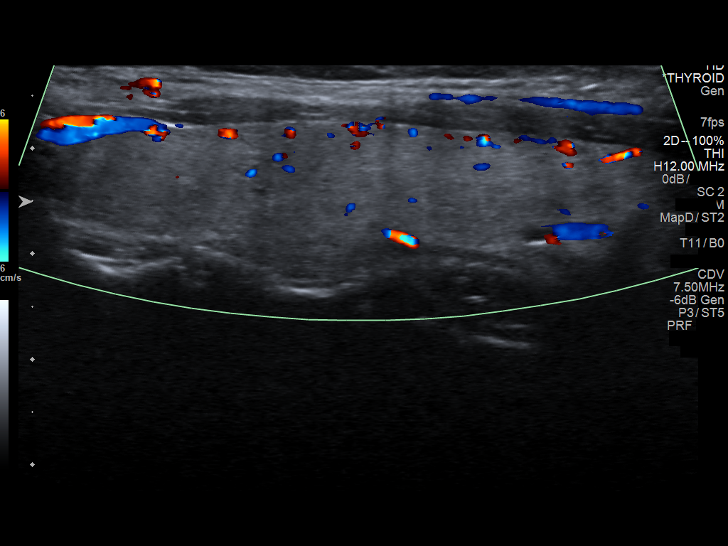
[im 37/37]
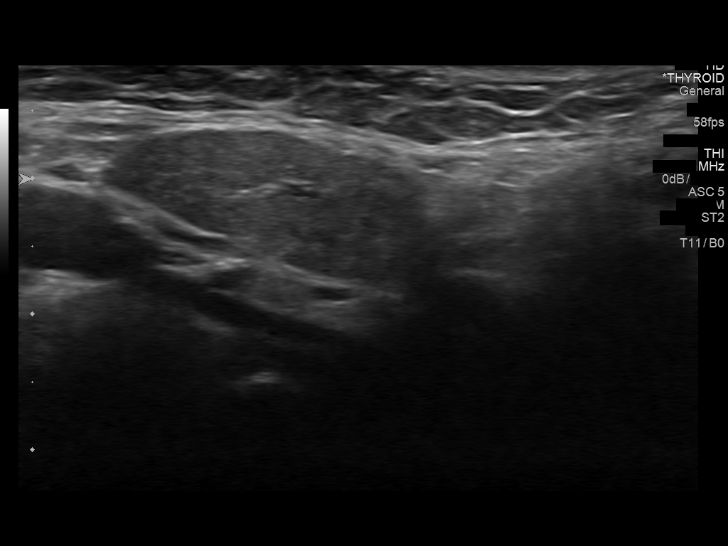

[14 of 25 positions shown; findings below may reference images not displayed]

FINDINGS: Parenchymal Echotexture: Normal

Isthmus: 0.3 cm

Right lobe: 5.7 x 1.3 x 2.2 cm

Left lobe: 6.1 x 1.0 x 1.9 cm

_________________________________________________________

Estimated total number of nodules >/= 1 cm: 0

Number of spongiform nodules >/=  2 cm not described below (TR1): 0

Number of mixed cystic and solid nodules >/= 1.5 cm not described
below (TR2): 0

_________________________________________________________

No discrete nodules are seen within the thyroid gland.
IMPRESSION: Within normal limits. No evidence of nodule. This study does not
meet criteria for biopsy nor follow-up.

The above is in keeping with the ACR TI-RADS recommendations - [HOSPITAL] 8629;[DATE].

## 2018-12-19 ENCOUNTER — Other Ambulatory Visit: Payer: Self-pay

## 2018-12-19 ENCOUNTER — Ambulatory Visit (INDEPENDENT_AMBULATORY_CARE_PROVIDER_SITE_OTHER): Payer: 59

## 2018-12-19 VITALS — BP 102/70 | HR 70 | Temp 97.4°F | Resp 14 | Ht 61.5 in | Wt 111.2 lb

## 2018-12-19 DIAGNOSIS — Z23 Encounter for immunization: Secondary | ICD-10-CM

## 2018-12-19 NOTE — Progress Notes (Signed)
Patient in today for 2nd Gardasil injection.   Contraception: None LMP: 12/18/2018 Last AEX: 10/02/18 with Dr. Quincy Simmonds  Injection given in right deltoid. Patient tolerated shot well.   Patient informed next injection due in about 4 months.  Advised patient, if not on birth control, to return for next injection with cycle.   Routed to provider for final review.  Encounter closed.

## 2018-12-23 ENCOUNTER — Other Ambulatory Visit: Payer: Self-pay | Admitting: Physician Assistant

## 2018-12-23 DIAGNOSIS — K589 Irritable bowel syndrome without diarrhea: Secondary | ICD-10-CM

## 2019-04-10 ENCOUNTER — Ambulatory Visit: Payer: 59

## 2019-04-15 ENCOUNTER — Ambulatory Visit (INDEPENDENT_AMBULATORY_CARE_PROVIDER_SITE_OTHER): Payer: 59 | Admitting: *Deleted

## 2019-04-15 ENCOUNTER — Other Ambulatory Visit: Payer: Self-pay

## 2019-04-15 VITALS — BP 100/60 | HR 72 | Temp 98.1°F | Resp 14 | Ht 61.0 in | Wt 111.1 lb

## 2019-04-15 DIAGNOSIS — Z23 Encounter for immunization: Secondary | ICD-10-CM

## 2019-04-15 NOTE — Progress Notes (Signed)
Patient in today for third Gardasil injection.   Contraception: none  LMP: 04-12-19 Last AEX: 10-02-2018 with Edward Jolly  Injection given in left deltoid. Patient tolerated shot well.   Patient informed she has completed the series.   Patient also complaining at nurse visit of bleeding with intercourse during the 4-5 days before her cycle starts. Patient voiced concerns as she and her husband are trying for pregnancy. Appointment offered with Dr. Edward Jolly for evaluation. Patient declined appointment later today, but scheduled for 04-16-19 at 1100. Agreeable to date and time of appointment.   Routed to provider for final review.  Encounter closed.

## 2019-04-16 ENCOUNTER — Encounter: Payer: Self-pay | Admitting: Obstetrics and Gynecology

## 2019-04-16 ENCOUNTER — Ambulatory Visit: Payer: 59 | Admitting: Obstetrics and Gynecology

## 2019-04-16 ENCOUNTER — Other Ambulatory Visit (HOSPITAL_COMMUNITY)
Admission: RE | Admit: 2019-04-16 | Discharge: 2019-04-16 | Disposition: A | Discharge status: Home / Self Care | Payer: 59 | Source: Ambulatory Visit | Attending: Obstetrics and Gynecology | Admitting: Obstetrics and Gynecology

## 2019-04-16 VITALS — BP 100/60 | HR 80 | Temp 96.9°F | Ht 61.5 in | Wt 110.0 lb

## 2019-04-16 DIAGNOSIS — N930 Postcoital and contact bleeding: Secondary | ICD-10-CM | POA: Insufficient documentation

## 2019-04-16 NOTE — Progress Notes (Signed)
GYNECOLOGY  VISIT   HPI: 39 y.o.   Married  Asian  female   G0P0 with Patient's last menstrual period was 04/12/2019 (exact date).   here for bleeding with intercourse for 1 week.  This occurred on 2/9 or 2/10.   No pain.  No vaginal itching.  No odor.  They did use a sexual toy once a month.   She is concerned about cancer.   Hx infertility.  They saw a fertility specialist.  Husband is not wanting to do a SA. She is accepting not having a child now.  Menses occurs once a month.   Doing the Covid vaccine series.  She is working with dialysis patients.   GYNECOLOGIC HISTORY: Patient's last menstrual period was 04/12/2019 (exact date). Contraception:  None Menopausal hormone therapy:  n/a Last mammogram:  n/a Last pap smear: 08-28-16 Neg:Neg HR HPV, 05-02-13 Neg:Neg HR HPV        OB History    Gravida  0   Para  0   Term      Preterm      AB      Living        SAB      TAB      Ectopic      Multiple      Live Births                 Patient Active Problem List   Diagnosis Date Noted  . Vitamin D insufficiency 10/03/2018  . History of hyperthyroidism 10/03/2017  . Intermittent palpitations 10/03/2017  . Nonproductive cough 10/03/2017  . Dyspepsia 10/03/2017    Past Medical History:  Diagnosis Date  . Infertility, female   . Thyroid disease    hyper, on meds x 1 year then OK  . Vitamin D insufficiency 10/03/2018    History reviewed. No pertinent surgical history.  Current Outpatient Medications  Medication Sig Dispense Refill  . Multiple Vitamin (MULTIVITAMIN WITH MINERALS) TABS tablet Take 1 tablet by mouth daily.     No current facility-administered medications for this visit.     ALLERGIES: Prednisone  Family History  Problem Relation Age of Onset  . Gout Mother   . Hyperlipidemia Mother   . Gout Father   . Alcohol abuse Brother   . Thyroid disease Brother     Social History   Socioeconomic History  . Marital status: Married     Spouse name: Not on file  . Number of children: 0  . Years of education: Not on file  . Highest education level: Not on file  Occupational History  . Not on file  Tobacco Use  . Smoking status: Never Smoker  . Smokeless tobacco: Never Used  Substance and Sexual Activity  . Alcohol use: No    Alcohol/week: 0.0 standard drinks  . Drug use: No  . Sexual activity: Yes    Partners: Male    Birth control/protection: None  Other Topics Concern  . Not on file  Social History Narrative  . Not on file   Social Determinants of Health   Financial Resource Strain:   . Difficulty of Paying Living Expenses: Not on file  Food Insecurity:   . Worried About Charity fundraiser in the Last Year: Not on file  . Ran Out of Food in the Last Year: Not on file  Transportation Needs:   . Lack of Transportation (Medical): Not on file  . Lack of Transportation (Non-Medical): Not on file  Physical Activity:   . Days of Exercise per Week: Not on file  . Minutes of Exercise per Session: Not on file  Stress:   . Feeling of Stress : Not on file  Social Connections:   . Frequency of Communication with Friends and Family: Not on file  . Frequency of Social Gatherings with Friends and Family: Not on file  . Attends Religious Services: Not on file  . Active Member of Clubs or Organizations: Not on file  . Attends Banker Meetings: Not on file  . Marital Status: Not on file  Intimate Partner Violence:   . Fear of Current or Ex-Partner: Not on file  . Emotionally Abused: Not on file  . Physically Abused: Not on file  . Sexually Abused: Not on file    Review of Systems  All other systems reviewed and are negative.   PHYSICAL EXAMINATION:    BP 100/60   Pulse 80   Temp (!) 96.9 F (36.1 C) (Temporal)   Ht 5' 1.5" (1.562 m)   Wt 110 lb (49.9 kg)   LMP 04/12/2019 (Exact Date)   BMI 20.45 kg/m     General appearance: alert, cooperative and appears stated age   Pelvic:  External genitalia:  no lesions              Urethra:  normal appearing urethra with no masses, tenderness or lesions              Bartholins and Skenes: normal                 Vagina: normal appearing vagina with normal color and discharge, no lesions              Cervix: no lesions.  Friable.  Menstrual blood in the vagina.                 Bimanual Exam:  Uterus:  normal size, contour, position, consistency, mobility, non-tender              Adnexa: no mass, fullness, tenderness         Chaperone was present for exam.  ASSESSMENT  Post coital bleeding.   PLAN  We discussed potential reasons for this - upcoming menstruation, dryness, instrumentation, polyps, abnormal cervical cells, GC/CT/trichmonas.  Unable to do pap today due to menstrual flow.  If bleeding recurs, will do pap and HR HPV.  Keep annual exam appointment for the fall, 2021.    An After Visit Summary was printed and given to the patient.  _20_____ minutes face to face time of which over 50% was spent in counseling.

## 2019-04-16 NOTE — Patient Instructions (Signed)
Please call me if the bleeding is recurrent.

## 2019-04-17 LAB — CERVICOVAGINAL ANCILLARY ONLY
Chlamydia: NEGATIVE
Comment: NEGATIVE
Comment: NEGATIVE
Comment: NORMAL
Neisseria Gonorrhea: NEGATIVE
Trichomonas: NEGATIVE

## 2019-11-18 NOTE — Progress Notes (Deleted)
39 y.o. G0P0 Married [place] female here for annual exam.    PCP:     No LMP recorded.           Sexually active: {yes no:314532}  The current method of family planning is {contraception:315051}.    Exercising: {yes no:314532}  {types:19826} Smoker:  no  Health Maintenance: Pap: 08-28-16 Neg:Neg HR HPV, 05-02-13 Neg:Neg HR HPV History of abnormal Pap:  no MMG:  n/a Colonoscopy:  n/a BMD:   n/a  Result  n/a TDaP:  08-02-15 Gardasil:   Yes, completed HIV:08-28-16 NR Hep C:*** Screening Labs:  Hb today: ***, Urine today: ***   reports that she has never smoked. She has never used smokeless tobacco. She reports that she does not drink alcohol and does not use drugs.  Past Medical History:  Diagnosis Date  . Infertility, female   . Thyroid disease    hyper, on meds x 1 year then OK  . Vitamin D insufficiency 10/03/2018    No past surgical history on file.  Current Outpatient Medications  Medication Sig Dispense Refill  . Multiple Vitamin (MULTIVITAMIN WITH MINERALS) TABS tablet Take 1 tablet by mouth daily.     No current facility-administered medications for this visit.    Family History  Problem Relation Age of Onset  . Gout Mother   . Hyperlipidemia Mother   . Gout Father   . Alcohol abuse Brother   . Thyroid disease Brother     Review of Systems  Exam:   There were no vitals taken for this visit.    General appearance: alert, cooperative and appears stated age Head: normocephalic, without obvious abnormality, atraumatic Neck: no adenopathy, supple, symmetrical, trachea midline and thyroid normal to inspection and palpation Lungs: clear to auscultation bilaterally Breasts: normal appearance, no masses or tenderness, No nipple retraction or dimpling, No nipple discharge or bleeding, No axillary adenopathy Heart: regular rate and rhythm Abdomen: soft, non-tender; no masses, no organomegaly Extremities: extremities normal, atraumatic, no cyanosis or edema Skin: skin  color, texture, turgor normal. No rashes or lesions Lymph nodes: cervical, supraclavicular, and axillary nodes normal. Neurologic: grossly normal  Pelvic: External genitalia:  no lesions              No abnormal inguinal nodes palpated.              Urethra:  normal appearing urethra with no masses, tenderness or lesions              Bartholins and Skenes: normal                 Vagina: normal appearing vagina with normal color and discharge, no lesions              Cervix: no lesions              Pap taken: {yes no:314532} Bimanual Exam:  Uterus:  normal size, contour, position, consistency, mobility, non-tender              Adnexa: no mass, fullness, tenderness              Rectal exam: {yes no:314532}.  Confirms.              Anus:  normal sphincter tone, no lesions  Chaperone was present for exam.  Assessment:   Well woman visit with normal exam.   Plan: Mammogram screening discussed. Self breast awareness reviewed. Pap and HR HPV as above. Guidelines for Calcium, Vitamin D, regular exercise  program including cardiovascular and weight bearing exercise.   Follow up annually and prn.   Additional counseling given.  {yes [id number]. _______ minutes face to face time of which over 50% was spent in counseling.    After visit summary provided.

## 2019-11-19 ENCOUNTER — Telehealth: Payer: Self-pay

## 2019-11-19 ENCOUNTER — Ambulatory Visit: Payer: 59 | Admitting: Obstetrics and Gynecology

## 2019-11-19 NOTE — Telephone Encounter (Signed)
Thank you for the update!

## 2019-11-19 NOTE — Telephone Encounter (Signed)
Patient was late to appointment today. Patient did not wish to reschedule at this time. Patient stated will call office back to reschedule.

## 2020-02-12 ENCOUNTER — Other Ambulatory Visit: Payer: Self-pay

## 2020-02-12 ENCOUNTER — Encounter: Payer: Self-pay | Admitting: Obstetrics and Gynecology

## 2020-02-12 ENCOUNTER — Telehealth: Payer: Self-pay | Admitting: Obstetrics and Gynecology

## 2020-02-12 ENCOUNTER — Ambulatory Visit: Payer: 59 | Admitting: Obstetrics and Gynecology

## 2020-02-12 VITALS — BP 98/64 | HR 72 | Ht 61.5 in | Wt 110.0 lb

## 2020-02-12 DIAGNOSIS — N6320 Unspecified lump in the left breast, unspecified quadrant: Secondary | ICD-10-CM

## 2020-02-12 NOTE — Telephone Encounter (Signed)
Patient "feels that something" is not right and would like an appointment with Dr.Silva asap.

## 2020-02-12 NOTE — Telephone Encounter (Signed)
Please schedule a bilateral mammogram and [facility].  She has a left breast mass, 5 cm at 3:00.   This will be her first mammogram.

## 2020-02-12 NOTE — Progress Notes (Signed)
GYNECOLOGY  VISIT   HPI: 39 y.o.   Married  Asian  female   G0P0 with Patient's last menstrual period was 02/02/2020 (exact date).   here for left breast mass. Patient states she found yesterday. Denies any redness or discomfort.  No pain.   Received her Covid vaccine in February.    No trauma.  GYNECOLOGIC HISTORY: Patient's last menstrual period was 02/02/2020 (exact date). Contraception: Trying Menopausal hormone therapy:  none Last mammogram: n/a Last pap smear:  08-28-16 Neg:Neg HR HPV, 05-02-13 Neg:Neg HR HPV        OB History    Gravida  0   Para  0   Term      Preterm      AB      Living        SAB      IAB      Ectopic      Multiple      Live Births                 Patient Active Problem List   Diagnosis Date Noted  . Vitamin D insufficiency 10/03/2018  . History of hyperthyroidism 10/03/2017  . Intermittent palpitations 10/03/2017  . Nonproductive cough 10/03/2017  . Dyspepsia 10/03/2017    Past Medical History:  Diagnosis Date  . Infertility, female   . Thyroid disease    hyper, on meds x 1 year then OK  . Vitamin D insufficiency 10/03/2018    History reviewed. No pertinent surgical history.  Current Outpatient Medications  Medication Sig Dispense Refill  . Multiple Vitamin (MULTIVITAMIN WITH MINERALS) TABS tablet Take 1 tablet by mouth daily.     No current facility-administered medications for this visit.     ALLERGIES: Prednisone  Family History  Problem Relation Age of Onset  . Gout Mother   . Hyperlipidemia Mother   . Gout Father   . Alcohol abuse Brother   . Thyroid disease Brother     Social History   Socioeconomic History  . Marital status: Married    Spouse name: Not on file  . Number of children: 0  . Years of education: Not on file  . Highest education level: Not on file  Occupational History  . Not on file  Tobacco Use  . Smoking status: Never Smoker  . Smokeless tobacco: Never Used  Vaping Use  .  Vaping Use: Never used  Substance and Sexual Activity  . Alcohol use: No    Alcohol/week: 0.0 standard drinks  . Drug use: No  . Sexual activity: Yes    Partners: Male    Birth control/protection: None  Other Topics Concern  . Not on file  Social History Narrative  . Not on file   Social Determinants of Health   Financial Resource Strain: Not on file  Food Insecurity: Not on file  Transportation Needs: Not on file  Physical Activity: Not on file  Stress: Not on file  Social Connections: Not on file  Intimate Partner Violence: Not on file    Review of Systems  All other systems reviewed and are negative.   PHYSICAL EXAMINATION:    BP 98/64   Pulse 72   Ht 5' 1.5" (1.562 m)   Wt 110 lb (49.9 kg)   LMP 02/02/2020 (Exact Date)   SpO2 98%   BMI 20.45 kg/m     General appearance: alert, cooperative and appears stated age   Breasts: right - normal appearance, no masses or  tenderness, No nipple retraction or dimpling, No nipple discharge or bleeding, No axillary or supraclavicular adenopathy Left - normal appearance, 5 cm smooth, mobile, nontender mass at 3:00.  No nipple retraction or dimpling, No nipple discharge or bleeding, No axillary or supraclavicular adenopathy   Chaperone was present for exam.  ASSESSMENT  Left breast mass. I suspect this is a cyst or fibroadenoma.   PLAN  We discussed evaluation of the breat mass through dx mammogram and left breast ultrasound.  Will schedule this.  I also discussed possible drainage of the area if it is cystic.  Questions invited and answered.  21 min  total time was spent for this patient encounter, including preparation, face-to-face counseling with the patient, coordination of care, and documentation of the encounter.

## 2020-02-12 NOTE — Telephone Encounter (Signed)
Call to patient. Patient states she woke up yesterday and felt "something not right" in her left breast. States it is not tender, but she feels a lump "near the back." Denies fever. States she has never had a MMG. LMP was 02-02-20 and lasted "a couple of days." OV recommended. Patient scheduled for 02-12-20 at 1600 with Dr. Edward Jolly. Patient agreeable to date and time of appointment. Aware to arrive 15 minutes prior to appointment time for check in.   Routing to provider and will close encounter.

## 2020-02-13 ENCOUNTER — Other Ambulatory Visit: Payer: Self-pay | Admitting: Obstetrics and Gynecology

## 2020-02-13 DIAGNOSIS — N6320 Unspecified lump in the left breast, unspecified quadrant: Secondary | ICD-10-CM

## 2020-02-13 NOTE — Telephone Encounter (Signed)
Call placed to [facility] for scheduling. Spoke with Melissa and they can see pt on 03/08/20 at 845 am.   Call placed back to pt. Spoke with pt. Pt agreeable and verbalized understanding to date and time of appt at [facility].   Appt at [facility] cancelled.   Routing to Dr Edward Jolly for review and update  Encounter closed  Orders faxed to [facility] today.

## 2020-02-13 NOTE — Telephone Encounter (Signed)
Call placed to [facility], Spoke with [place]. Pt scheduled for bil MMG and left breast US on 03/25/2020 at 240 pm as first available appt.   Call placed to pt. Spoke with pt. Pt given time and date of MMG/US appt. Pt verbalized understanding. Pt advised to call for cancellations per TBC. TBC number given to pt. Pt thankful for appt.  Routing to Dr Edward Jolly.  Orders placed by TBC for co-sign.  Encounter closed  Cc: Noreene Larsson, RN for MMG hold

## 2020-02-13 NOTE — Telephone Encounter (Signed)
Patient placed in MMG hold.

## 2020-02-13 NOTE — Telephone Encounter (Signed)
Please try to schedule the patient sooner at [facility].

## 2020-02-28 DIAGNOSIS — Z8616 Personal history of COVID-19: Secondary | ICD-10-CM

## 2020-02-28 HISTORY — DX: Personal history of COVID-19: Z86.16

## 2020-03-08 ENCOUNTER — Encounter: Payer: Self-pay | Admitting: Obstetrics and Gynecology

## 2020-03-18 ENCOUNTER — Encounter: Payer: Self-pay | Admitting: Obstetrics and Gynecology

## 2020-03-25 ENCOUNTER — Other Ambulatory Visit: Payer: 59

## 2020-07-29 ENCOUNTER — Ambulatory Visit (INDEPENDENT_AMBULATORY_CARE_PROVIDER_SITE_OTHER): Payer: 59 | Admitting: Obstetrics and Gynecology

## 2020-07-29 ENCOUNTER — Other Ambulatory Visit: Payer: Self-pay

## 2020-07-29 ENCOUNTER — Encounter: Payer: Self-pay | Admitting: Obstetrics and Gynecology

## 2020-07-29 VITALS — BP 98/64 | HR 77 | Ht 61.0 in | Wt 109.8 lb

## 2020-07-29 DIAGNOSIS — Z01419 Encounter for gynecological examination (general) (routine) without abnormal findings: Secondary | ICD-10-CM

## 2020-07-29 DIAGNOSIS — N6320 Unspecified lump in the left breast, unspecified quadrant: Secondary | ICD-10-CM

## 2020-07-29 MED ORDER — IBUPROFEN 800 MG PO TABS: 800.0000 mg | ORAL_TABLET | Freq: Three times a day (TID) | ORAL | 2 refills | Status: DC | PRN

## 2020-07-29 NOTE — Progress Notes (Signed)
40 y.o. G0P0 Married [place] female here for annual exam.    Cyst may be back in left breast. She had this drained.   Menses light flow.  Cramping on the first day.  Uses Ibuprofen.   Wants blood work.  PCP:   None  Patient's last menstrual period was 07/27/2020 (exact date).     Period Cycle (Days): 30 Period Duration (Days): 2-3 days Period Pattern: Regular Menstrual Flow: Light Menstrual Control: Thin pad Dysmenorrhea: (!) Moderate (cramps only first day) Dysmenorrhea Symptoms: Cramping     Sexually active: Yes.    The current method of family planning is none--Trying for pregnancy   Exercising: Yes.    walks and yoga in AM Smoker:  no  Health Maintenance: Pap: 08-28-16 Neg:Neg HR HPV, 05-02-13 Neg:Neg HR HPV History of abnormal Pap:  no MMG:  03-08-20 3D Diag.Bil/Lt.Br.oval mass 3 o'clock;Lt.Br.US Neg/BiRads2 Colonoscopy:  n/a BMD:   n/a  Result  n/a TDaP:  08-02-15 Gardasil: completed HIV:08-28-16 NR Hep C:no Screening Labs:  Today .   reports that she has never smoked. She has never used smokeless tobacco. She reports that she does not drink alcohol and does not use drugs.  Past Medical History:  Diagnosis Date  . History of COVID-19 02/2020  . Infertility, female   . Thyroid disease    hyper, on meds x 1 year then OK  . Vitamin D insufficiency 10/03/2018    History reviewed. No pertinent surgical history.  Current Outpatient Medications  Medication Sig Dispense Refill  . Multiple Vitamin (MULTIVITAMIN WITH MINERALS) TABS tablet Take 1 tablet by mouth daily.     No current facility-administered medications for this visit.    Family History  Problem Relation Age of Onset  . Gout Mother   . Hyperlipidemia Mother   . Gout Father   . Alcohol abuse Brother   . Thyroid disease Brother     Review of Systems  All other systems reviewed and are negative.   Exam:   BP 98/64   Pulse 77   Ht 5\' 1"  (1.549 m)   Wt 109 lb 12.8 oz (49.8 kg)   LMP 07/27/2020  (Exact Date)   SpO2 99%   BMI 20.75 kg/m     General appearance: alert, cooperative and appears stated age Head: normocephalic, without obvious abnormality, atraumatic Neck: no adenopathy, supple, symmetrical, trachea midline and thyroid normal to inspection and palpation Lungs: clear to auscultation bilaterally Breasts: right - normal appearance, no masses or tenderness, No nipple retraction or dimpling, No nipple discharge or bleeding, No axillary adenopathy Left - 2 cm smooth mobile mass at 4:00.  No tenderness.  No nipple retraction or dimpling, No nipple discharge or bleeding, No axillary adenopathy Heart: regular rate and rhythm Abdomen: soft, non-tender; no masses, no organomegaly Extremities: extremities normal, atraumatic, no cyanosis or edema Skin: skin color, texture, turgor normal. No rashes or lesions Lymph nodes: cervical, supraclavicular, and axillary nodes normal. Neurologic: grossly normal  Pelvic: External genitalia:  no lesions              No abnormal inguinal nodes palpated.              Urethra:  normal appearing urethra with no masses, tenderness or lesions              Bartholins and Skenes: normal                 Vagina: normal appearing vagina with normal color and discharge, no  lesions              Cervix: no lesions.  Moderate menstrual flow noted.               Pap taken: No. Bimanual Exam:  Uterus:  normal size, contour, position, consistency, mobility, non-tender              Adnexa: no mass, fullness, tenderness      Chaperone was present for exam.  Assessment:   Well woman visit with normal exam. Dysmenorrhea.  Left breast mass, hx cyst drainage. Hx infertility.  Hx hyperthyroidism.  Plan: Mammogram screening discussed. Self breast awareness reviewed. Pap and HR HPV as above. Guidelines for Calcium, Vitamin D, regular exercise program including cardiovascular and weight bearing exercise. Take PNV.  Motrin 800 mg every 8 hours prn.  Routine  labs.  Follow up visit in 6 weeks for a breast check.  No drainage of cyst recommended at this time.  Follow up annually and prn.

## 2020-07-29 NOTE — Patient Instructions (Signed)

## 2020-07-30 LAB — LIPID PANEL
Cholesterol: 171 mg/dL (ref <200)
HDL: 65 mg/dL (ref >=50)
LDL Cholesterol (Calc): 81 mg/dL (calc)
Non-HDL Cholesterol (Calc): 106 mg/dL (calc) (ref <130)
Total CHOL/HDL Ratio: 2.6 (calc) (ref <5.0)
Triglycerides: 148 mg/dL (ref <150)

## 2020-07-30 LAB — COMPREHENSIVE METABOLIC PANEL
AG Ratio: 1.4 (calc) (ref 1.0–2.5)
ALT: 12 U/L (ref 6–29)
AST: 18 U/L (ref 10–30)
Albumin: 4.4 g/dL (ref 3.6–5.1)
Alkaline phosphatase (APISO): 47 U/L (ref 31–125)
BUN: 12 mg/dL (ref 7–25)
CO2: 27 mmol/L (ref 20–32)
Calcium: 9.5 mg/dL (ref 8.6–10.2)
Chloride: 104 mmol/L (ref 98–110)
Creat: 0.77 mg/dL (ref 0.50–1.10)
Globulin: 3.1 g/dL (calc) (ref 1.9–3.7)
Glucose, Bld: 89 mg/dL (ref 65–99)
Potassium: 4.1 mmol/L (ref 3.5–5.3)
Sodium: 138 mmol/L (ref 135–146)
Total Bilirubin: 0.5 mg/dL (ref 0.2–1.2)
Total Protein: 7.5 g/dL (ref 6.1–8.1)

## 2020-07-30 LAB — CBC
HCT: 38.7 % (ref 35.0–45.0)
Hemoglobin: 12.1 g/dL (ref 11.7–15.5)
MCH: 26 pg — ABNORMAL LOW (ref 27.0–33.0)
MCHC: 31.3 g/dL — ABNORMAL LOW (ref 32.0–36.0)
MCV: 83.2 fL (ref 80.0–100.0)
MPV: 10.4 fL (ref 7.5–12.5)
Platelets: 351 10*3/uL (ref 140–400)
RBC: 4.65 10*6/uL (ref 3.80–5.10)
RDW: 11.9 % (ref 11.0–15.0)
WBC: 8.6 10*3/uL (ref 3.8–10.8)

## 2020-07-30 LAB — T4, FREE: Free T4: 1.2 ng/dL (ref 0.8–1.8)

## 2020-07-30 LAB — VITAMIN D 25 HYDROXY (VIT D DEFICIENCY, FRACTURES): Vit D, 25-Hydroxy: 48 ng/mL (ref 30–100)

## 2020-07-30 LAB — TSH: TSH: 1.12 mIU/L

## 2020-09-09 ENCOUNTER — Encounter: Payer: Self-pay | Admitting: Obstetrics and Gynecology

## 2020-09-09 ENCOUNTER — Ambulatory Visit: Payer: 59 | Admitting: Obstetrics and Gynecology

## 2020-09-09 ENCOUNTER — Other Ambulatory Visit: Payer: Self-pay

## 2020-09-09 VITALS — BP 116/74

## 2020-09-09 DIAGNOSIS — N6320 Unspecified lump in the left breast, unspecified quadrant: Secondary | ICD-10-CM

## 2020-09-09 NOTE — Progress Notes (Signed)
GYNECOLOGY  VISIT   HPI: 40 y.o.   Married  Asian  female   G0P0 with Patient's last menstrual period was 08/27/2020.   here for   Follow up Breast check  Seen for annual exam 07/29/20 and had 2 cm left breast cyst at 4:00.  The area is the same size.  No pain.   She has hx of aspiration of left breast cyst at [facility] on 03/18/20.  Documentation of the aspiration is not available at the current time.   Lives in [place].   GYNECOLOGIC HISTORY: Patient's last menstrual period was 08/27/2020. Contraception:  none Menopausal hormone therapy:  none Last mammogram:  03-08-20 - Diag.Bil/Lt.Br.oval mass 3 o'clock;Lt.Br.US Neg/BiRads2 Last pap smear:   08-28-16        OB History     Gravida  0   Para  0   Term      Preterm      AB      Living         SAB      IAB      Ectopic      Multiple      Live Births                 Patient Active Problem List   Diagnosis Date Noted   Vitamin D insufficiency 10/03/2018   History of hyperthyroidism 10/03/2017   Intermittent palpitations 10/03/2017   Nonproductive cough 10/03/2017   Dyspepsia 10/03/2017    Past Medical History:  Diagnosis Date   History of COVID-19 02/2020   Infertility, female    Thyroid disease    hyper, on meds x 1 year then OK   Vitamin D insufficiency 10/03/2018    No past surgical history on file.  Current Outpatient Medications  Medication Sig Dispense Refill   ibuprofen (ADVIL) 800 MG tablet Take 1 tablet (800 mg total) by mouth every 8 (eight) hours as needed. 60 tablet 2   Multiple Vitamin (MULTIVITAMIN WITH MINERALS) TABS tablet Take 1 tablet by mouth daily.     No current facility-administered medications for this visit.     ALLERGIES: Prednisone  Family History  Problem Relation Age of Onset   Gout Mother    Hyperlipidemia Mother    Gout Father    Alcohol abuse Brother    Thyroid disease Brother     Social History   Socioeconomic History   Marital status: Married     Spouse name: Not on file   Number of children: 0   Years of education: Not on file   Highest education level: Not on file  Occupational History   Not on file  Tobacco Use   Smoking status: Never   Smokeless tobacco: Never  Vaping Use   Vaping Use: Never used  Substance and Sexual Activity   Alcohol use: No    Alcohol/week: 0.0 standard drinks   Drug use: No   Sexual activity: Yes    Partners: Male    Birth control/protection: None  Other Topics Concern   Not on file  Social History Narrative   Not on file   Social Determinants of Health   Financial Resource Strain: Not on file  Food Insecurity: Not on file  Transportation Needs: Not on file  Physical Activity: Not on file  Stress: Not on file  Social Connections: Not on file  Intimate Partner Violence: Not on file    Review of Systems  PHYSICAL EXAMINATION:    BP 116/74 (BP  Location: Right Arm, Patient Position: Sitting, Cuff Size: Normal)   LMP 08/27/2020     General appearance: alert, cooperative and appears stated age  Breasts: right - normal appearance, no masses or tenderness, No nipple retraction or dimpling, No nipple discharge or bleeding, No axillary or supraclavicular adenopathy Left - 2 cm mass at 3:00 position. No nipple retraction or dimpling, No nipple discharge or bleeding, No axillary or supraclavicular adenopathy  Chaperone was present for exam.  ASSESSMENT  Left breast mass.  Uncertain if this is recurrent of a new mass.  Hx left breast cyst aspiration.   PLAN  Will get a copy of her left breast aspiration.  She may need to return for additional dx left mammogram and ultrasound at [facility].    20 min total time was spent for this patient encounter, including preparation, face-to-face counseling with the patient, coordination of care, and documentation of the encounter.

## 2020-09-12 ENCOUNTER — Telehealth: Payer: Self-pay | Admitting: Obstetrics and Gynecology

## 2020-09-12 NOTE — Telephone Encounter (Signed)
Please get copy of the patient's left breast cyst aspiration done at [facility] in January, 2022.  Coordinate with nursing staff who may have already reached out for this document.    Patient has a 2 cm left breast mass at about 3:00.   The may need additional diagnostic imaging of the left side (Dx mammogram and left breast US), but I would like to see her aspiration documentation first.   Please place her in mammogram hold for August, 2022.

## 2020-09-13 NOTE — Telephone Encounter (Signed)
Please let the patient know that I received a copy of her left breast cyst aspiration at [facility] from 03/18/20.  The cyst was at the 3:00 position and was 4.4 cm prior to aspiration.  There is documentation that size of the cyst was markedly smaller on ultrasound after the aspiration.   [facility] made a recommendation for her to have a routine mammogram in 2 years.   Theses findings correlate with her recent office exam with me, so she does not need any additional imaging at this time.   Please have her contact the office if the area is enlarging or becomes painful.

## 2020-09-14 NOTE — Telephone Encounter (Signed)
Patient informed. 

## 2021-03-25 ENCOUNTER — Telehealth: Payer: Self-pay | Admitting: *Deleted

## 2021-03-25 NOTE — Telephone Encounter (Signed)
Patient called wanting to confirm she had all 3 series of Gardasil. Patient aware all 3 have be completed.

## 2021-09-27 DIAGNOSIS — R7989 Other specified abnormal findings of blood chemistry: Secondary | ICD-10-CM

## 2021-09-27 HISTORY — DX: Other specified abnormal findings of blood chemistry: R79.89

## 2021-10-10 NOTE — Progress Notes (Unsigned)
41 y.o. G0P0 Married [place] female here for annual exam.    PCP:     No LMP recorded.           Sexually active: {yes no:314532}  The current method of family planning is*** none.    Exercising: {yes no:314532}  {types:19826} Smoker:  no  Health Maintenance: Pap: 08-28-16 Neg:Neg HR HPV, 05-02-13 Neg:Neg HR HPV History of abnormal Pap:  no MMG:  03-08-20 3D Diag.Bil/Lt.Br.oval mass 3 o'clock;Lt.Br.US Neg/BiRads2 Colonoscopy:  n/a BMD:   n/a  Result  n/a TDaP:  08-02-15 Gardasil:   COMPLETED HIV:08-28-16 NR Hep C:no Screening Labs:  Hb today: ***, Urine today: ***   reports that she has never smoked. She has never used smokeless tobacco. She reports that she does not drink alcohol and does not use drugs.  Past Medical History:  Diagnosis Date   History of COVID-19 02/2020   Infertility, female    Thyroid disease    hyper, on meds x 1 year then OK   Vitamin D insufficiency 10/03/2018    No past surgical history on file.  Current Outpatient Medications  Medication Sig Dispense Refill   ibuprofen (ADVIL) 800 MG tablet Take 1 tablet (800 mg total) by mouth every 8 (eight) hours as needed. 60 tablet 2   Multiple Vitamin (MULTIVITAMIN WITH MINERALS) TABS tablet Take 1 tablet by mouth daily.     No current facility-administered medications for this visit.    Family History  Problem Relation Age of Onset   Gout Mother    Hyperlipidemia Mother    Gout Father    Alcohol abuse Brother    Thyroid disease Brother     Review of Systems  Exam:   There were no vitals taken for this visit.    General appearance: alert, cooperative and appears stated age Head: normocephalic, without obvious abnormality, atraumatic Neck: no adenopathy, supple, symmetrical, trachea midline and thyroid normal to inspection and palpation Lungs: clear to auscultation bilaterally Breasts: normal appearance, no masses or tenderness, No nipple retraction or dimpling, No nipple discharge or bleeding, No  axillary adenopathy Heart: regular rate and rhythm Abdomen: soft, non-tender; no masses, no organomegaly Extremities: extremities normal, atraumatic, no cyanosis or edema Skin: skin color, texture, turgor normal. No rashes or lesions Lymph nodes: cervical, supraclavicular, and axillary nodes normal. Neurologic: grossly normal  Pelvic: External genitalia:  no lesions              No abnormal inguinal nodes palpated.              Urethra:  normal appearing urethra with no masses, tenderness or lesions              Bartholins and Skenes: normal                 Vagina: normal appearing vagina with normal color and discharge, no lesions              Cervix: no lesions              Pap taken: {yes no:314532} Bimanual Exam:  Uterus:  normal size, contour, position, consistency, mobility, non-tender              Adnexa: no mass, fullness, tenderness              Rectal exam: {yes no:314532}.  Confirms.              Anus:  normal sphincter tone, no lesions  Chaperone was present  for exam:  ***  Assessment:   Well woman visit with gynecologic exam.   Plan: Mammogram screening discussed. Self breast awareness reviewed. Pap and HR HPV as above. Guidelines for Calcium, Vitamin D, regular exercise program including cardiovascular and weight bearing exercise.   Follow up annually and prn.   Additional counseling given.  {yes [id number]. _______ minutes face to face time of which over 50% was spent in counseling.    After visit summary provided.

## 2021-10-12 ENCOUNTER — Encounter: Payer: Self-pay | Admitting: Obstetrics and Gynecology

## 2021-10-12 ENCOUNTER — Ambulatory Visit (INDEPENDENT_AMBULATORY_CARE_PROVIDER_SITE_OTHER): Payer: 59 | Admitting: Obstetrics and Gynecology

## 2021-10-12 ENCOUNTER — Other Ambulatory Visit (HOSPITAL_COMMUNITY)
Admission: RE | Admit: 2021-10-12 | Discharge: 2021-10-12 | Disposition: A | Discharge status: Home / Self Care | Payer: 59 | Source: Ambulatory Visit | Attending: Obstetrics and Gynecology | Admitting: Obstetrics and Gynecology

## 2021-10-12 VITALS — BP 100/64 | HR 95 | Ht 61.5 in | Wt 114.0 lb

## 2021-10-12 DIAGNOSIS — Z8639 Personal history of other endocrine, nutritional and metabolic disease: Secondary | ICD-10-CM

## 2021-10-12 DIAGNOSIS — Z01419 Encounter for gynecological examination (general) (routine) without abnormal findings: Secondary | ICD-10-CM

## 2021-10-12 DIAGNOSIS — Z124 Encounter for screening for malignant neoplasm of cervix: Secondary | ICD-10-CM | POA: Insufficient documentation

## 2021-10-12 DIAGNOSIS — Z0000 Encounter for general adult medical examination without abnormal findings: Secondary | ICD-10-CM

## 2021-10-12 MED ORDER — IBUPROFEN 800 MG PO TABS: 800.0000 mg | ORAL_TABLET | Freq: Three times a day (TID) | ORAL | 2 refills | Status: DC | PRN

## 2021-10-12 NOTE — Patient Instructions (Signed)

## 2021-10-13 LAB — LIPID PANEL
Cholesterol: 183 mg/dL (ref <200)
HDL: 71 mg/dL (ref >=50)
LDL Cholesterol (Calc): 85 mg/dL (calc)
Non-HDL Cholesterol (Calc): 112 mg/dL (calc) (ref <130)
Total CHOL/HDL Ratio: 2.6 (calc) (ref <5.0)
Triglycerides: 175 mg/dL — ABNORMAL HIGH (ref <150)

## 2021-10-13 LAB — COMPREHENSIVE METABOLIC PANEL
AG Ratio: 1.5 (calc) (ref 1.0–2.5)
ALT: 270 U/L — ABNORMAL HIGH (ref 6–29)
AST: 126 U/L — ABNORMAL HIGH (ref 10–30)
Albumin: 4.4 g/dL (ref 3.6–5.1)
Alkaline phosphatase (APISO): 50 U/L (ref 31–125)
BUN: 12 mg/dL (ref 7–25)
CO2: 29 mmol/L (ref 20–32)
Calcium: 9.6 mg/dL (ref 8.6–10.2)
Chloride: 103 mmol/L (ref 98–110)
Creat: 0.78 mg/dL (ref 0.50–0.99)
Globulin: 2.9 g/dL (calc) (ref 1.9–3.7)
Glucose, Bld: 86 mg/dL (ref 65–99)
Potassium: 4.3 mmol/L (ref 3.5–5.3)
Sodium: 139 mmol/L (ref 135–146)
Total Bilirubin: 0.6 mg/dL (ref 0.2–1.2)
Total Protein: 7.3 g/dL (ref 6.1–8.1)

## 2021-10-13 LAB — CBC
HCT: 38.1 % (ref 35.0–45.0)
Hemoglobin: 12.4 g/dL (ref 11.7–15.5)
MCH: 26.7 pg — ABNORMAL LOW (ref 27.0–33.0)
MCHC: 32.5 g/dL (ref 32.0–36.0)
MCV: 81.9 fL (ref 80.0–100.0)
MPV: 10.4 fL (ref 7.5–12.5)
Platelets: 338 10*3/uL (ref 140–400)
RBC: 4.65 10*6/uL (ref 3.80–5.10)
RDW: 11.7 % (ref 11.0–15.0)
WBC: 10.8 10*3/uL (ref 3.8–10.8)

## 2021-10-13 LAB — TSH: TSH: 1.26 mIU/L

## 2021-10-14 ENCOUNTER — Encounter: Payer: Self-pay | Admitting: Obstetrics and Gynecology

## 2021-10-14 LAB — CYTOLOGY - PAP
Comment: NEGATIVE
Diagnosis: UNDETERMINED — AB
High risk HPV: NEGATIVE

## 2021-10-17 ENCOUNTER — Encounter: Payer: Self-pay | Admitting: Obstetrics and Gynecology

## 2021-10-17 NOTE — Telephone Encounter (Signed)
Patient inquiring regarding Pap result.

## 2021-10-24 ENCOUNTER — Other Ambulatory Visit: Payer: Self-pay | Admitting: Gastroenterology

## 2021-10-24 DIAGNOSIS — R7989 Other specified abnormal findings of blood chemistry: Secondary | ICD-10-CM

## 2021-10-26 ENCOUNTER — Ambulatory Visit
Admission: RE | Admit: 2021-10-26 | Discharge: 2021-10-26 | Disposition: A | Discharge status: Home / Self Care | Payer: 59 | Source: Ambulatory Visit | Attending: Gastroenterology | Admitting: Gastroenterology

## 2021-10-26 DIAGNOSIS — R7989 Other specified abnormal findings of blood chemistry: Secondary | ICD-10-CM

## 2021-12-14 ENCOUNTER — Other Ambulatory Visit: Payer: Self-pay | Admitting: Gastroenterology

## 2021-12-14 ENCOUNTER — Other Ambulatory Visit: Payer: Self-pay

## 2021-12-14 ENCOUNTER — Other Ambulatory Visit (HOSPITAL_COMMUNITY): Payer: Self-pay

## 2021-12-14 DIAGNOSIS — R1011 Right upper quadrant pain: Secondary | ICD-10-CM

## 2021-12-14 DIAGNOSIS — R748 Abnormal levels of other serum enzymes: Secondary | ICD-10-CM

## 2021-12-26 ENCOUNTER — Other Ambulatory Visit (HOSPITAL_COMMUNITY): Payer: 59

## 2022-01-02 ENCOUNTER — Encounter (HOSPITAL_COMMUNITY)
Admission: RE | Admit: 2022-01-02 | Discharge: 2022-01-02 | Disposition: A | Discharge status: Home / Self Care | Payer: 59 | Source: Ambulatory Visit | Attending: Gastroenterology | Admitting: Gastroenterology

## 2022-01-02 DIAGNOSIS — R748 Abnormal levels of other serum enzymes: Secondary | ICD-10-CM | POA: Insufficient documentation

## 2022-01-02 DIAGNOSIS — R1011 Right upper quadrant pain: Secondary | ICD-10-CM | POA: Diagnosis present

## 2022-01-02 MED ORDER — TECHNETIUM TC 99M MEBROFENIN IV KIT
5.2600 | PACK | Freq: Once | INTRAVENOUS | Status: AC | PRN
  Administered 2022-01-02: 5.26 via INTRAVENOUS

## 2022-10-04 NOTE — Progress Notes (Signed)
42 y.o. G0P0 Married [place] female here for annual exam.   Left breast mass has come back. Not painful.  Hx left breast cyst aspiration.   Periods are less painful.  Not heavy.  Ibuprofen is helpful.  Does not need refill.   She saw Gi at [facility] for evaluation of her elevated liver function.  She did an [place] and she has fatty liver per patient.  Had US imaging and testing for hepatitis, which was negative per patient.  She is following a healthy diet.  States her LFTs went down into the 30 range.   Not sexually active.   PCP:   None  Patient's last menstrual period was 10/04/2022.     Period Cycle (Days): 28 Period Duration (Days): 3-4 Period Pattern: Regular Menstrual Flow: Light Menstrual Control: Thin pad Dysmenorrhea: (!) Moderate     Sexually active: No.  The current method of family planning is abstinence.    Exercising: Yes.     walking Smoker:  no  Health Maintenance: Pap:  10/12/21 ASCUS: HR HPV neg History of abnormal Pap:  no MMG:  03/08/20 Breast Density Cat D, Lt.Br.oval mass 3 o'clock;Lt.Br.US Neg/BiRads2.  Colonoscopy:  n/a BMD:   n/a  Result  n/a TDaP:  08/02/15 Gardasil:   yes HIV: 08/28/16 NR Hep C: n/a Screening Labs:  PCP   reports that she has never smoked. She has never used smokeless tobacco. She reports that she does not drink alcohol and does not use drugs.  Past Medical History:  Diagnosis Date   Elevated LFTs 09/2021   History of COVID-19 02/2020   Infertility, female    Thyroid disease    hyper, on meds x 1 year then OK   Vitamin D insufficiency 10/03/2018    History reviewed. No pertinent surgical history.  Current Outpatient Medications  Medication Sig Dispense Refill   ibuprofen (ADVIL) 800 MG tablet Take 1 tablet (800 mg total) by mouth every 8 (eight) hours as needed. 30 tablet 2   Multiple Vitamin (MULTIVITAMIN WITH MINERALS) TABS tablet Take 1 tablet by mouth daily.     No current facility-administered medications for this visit.     Family History  Problem Relation Age of Onset   Gout Mother    Hyperlipidemia Mother    Hypertension Father    Stroke Father    Gout Father    Alcohol abuse Brother    Thyroid disease Brother     Review of Systems  All other systems reviewed and are negative.   Exam:   BP 122/74 (BP Location: Left Arm, Patient Position: Sitting, Cuff Size: Normal)   Pulse 74   Ht 5' 1.5" (1.562 m)   Wt 118 lb (53.5 kg)   LMP 10/04/2022   SpO2 99%   BMI 21.93 kg/m     General appearance: alert, cooperative and appears stated age Head: normocephalic, without obvious abnormality, atraumatic Neck: no adenopathy, supple, symmetrical, trachea midline and thyroid normal to inspection and palpation Lungs: clear to auscultation bilaterally Breasts: right - normal appearance, no masses or tenderness, No nipple retraction or dimpling, No nipple discharge or bleeding, No axillary adenopathy  Left - normal appearance, 1.5 cm mass at 6:00, No nipple retraction or dimpling, No nipple discharge or bleeding, No axillary adenopathy Heart: regular rate and rhythm Abdomen: soft, non-tender; no masses, no organomegaly Extremities: extremities normal, atraumatic, no cyanosis or edema Skin: skin color, texture, turgor normal. No rashes or lesions Lymph nodes: cervical, supraclavicular, and axillary nodes normal. Neurologic:  grossly normal  Pelvic: External genitalia:  no lesions              No abnormal inguinal nodes palpated.              Urethra:  normal appearing urethra with no masses, tenderness or lesions              Bartholins and Skenes: normal                 Vagina: normal appearing vagina with normal color and discharge, no lesions              Cervix: no lesions              Pap taken: no Bimanual Exam:  Uterus:  normal size, contour, position, consistency, mobility, non-tender              Adnexa: no mass, fullness, tenderness              Rectal exam: yes.  Confirms.              Anus:   normal sphincter tone, no lesions  Chaperone was present for exam:  Warren Lacy, CMA  Assessment:   Well woman visit with gynecologic exam. Left breast mass.  Hx cyst. Dysmenorrhea.  Hx infertility.  Hx hyperthyroidism. Elevated LFTs.   Plan: Mammogram screening discussed. Self breast awareness reviewed. Pap and HR HPV 2026. Guidelines for Calcium, Vitamin D, regular exercise program including cardiovascular and weight bearing exercise. Routine labs:  cholesterol, TFTs, CMP, CBC . Will schedule dx bilateral mammogram and left breast US at [facility].  We discussed a possible PCP for her in [facility]. Follow up annually and prn.   After visit summary provided.

## 2022-10-18 ENCOUNTER — Encounter: Payer: Self-pay | Admitting: Obstetrics and Gynecology

## 2022-10-18 ENCOUNTER — Telehealth: Payer: Self-pay | Admitting: Obstetrics and Gynecology

## 2022-10-18 ENCOUNTER — Ambulatory Visit (INDEPENDENT_AMBULATORY_CARE_PROVIDER_SITE_OTHER): Payer: 59 | Admitting: Obstetrics and Gynecology

## 2022-10-18 VITALS — BP 122/74 | HR 74 | Ht 61.5 in | Wt 118.0 lb

## 2022-10-18 DIAGNOSIS — Z01419 Encounter for gynecological examination (general) (routine) without abnormal findings: Secondary | ICD-10-CM

## 2022-10-18 DIAGNOSIS — Z0000 Encounter for general adult medical examination without abnormal findings: Secondary | ICD-10-CM

## 2022-10-18 NOTE — Telephone Encounter (Signed)
Please schedule bilateral dx mammogram and left breast US at [facility].  My patient has a left breast mass 1.5 cm at 6:00.

## 2022-10-18 NOTE — Patient Instructions (Signed)

## 2022-10-19 LAB — LIPID PANEL
Cholesterol: 211 mg/dL — ABNORMAL HIGH (ref <200)
HDL: 81 mg/dL (ref >=50)
LDL Cholesterol (Calc): 112 mg/dL — ABNORMAL HIGH
Non-HDL Cholesterol (Calc): 130 mg/dL — ABNORMAL HIGH (ref <130)
Total CHOL/HDL Ratio: 2.6 (calc) (ref <5.0)
Triglycerides: 79 mg/dL (ref <150)

## 2022-10-19 LAB — TSH: TSH: 1.21 m[IU]/L

## 2022-10-19 LAB — CBC
HCT: 41.5 % (ref 35.0–45.0)
Hemoglobin: 13.2 g/dL (ref 11.7–15.5)
MCH: 26.2 pg — ABNORMAL LOW (ref 27.0–33.0)
MCHC: 31.8 g/dL — ABNORMAL LOW (ref 32.0–36.0)
MCV: 82.5 fL (ref 80.0–100.0)
MPV: 10.4 fL (ref 7.5–12.5)
Platelets: 366 10*3/uL (ref 140–400)
RBC: 5.03 10*6/uL (ref 3.80–5.10)
RDW: 11.9 % (ref 11.0–15.0)
WBC: 8.8 10*3/uL (ref 3.8–10.8)

## 2022-10-19 LAB — COMPREHENSIVE METABOLIC PANEL
AG Ratio: 1.5 (calc) (ref 1.0–2.5)
ALT: 13 U/L (ref 6–29)
AST: 19 U/L (ref 10–30)
Albumin: 4.9 g/dL (ref 3.6–5.1)
Alkaline phosphatase (APISO): 48 U/L (ref 31–125)
BUN: 11 mg/dL (ref 7–25)
CO2: 25 mmol/L (ref 20–32)
Calcium: 9.7 mg/dL (ref 8.6–10.2)
Chloride: 102 mmol/L (ref 98–110)
Creat: 0.68 mg/dL (ref 0.50–0.99)
Globulin: 3.2 g/dL (ref 1.9–3.7)
Glucose, Bld: 82 mg/dL (ref 65–99)
Potassium: 3.9 mmol/L (ref 3.5–5.3)
Sodium: 138 mmol/L (ref 135–146)
Total Bilirubin: 0.7 mg/dL (ref 0.2–1.2)
Total Protein: 8.1 g/dL (ref 6.1–8.1)

## 2022-10-19 LAB — T3, FREE: T3, Free: 3 pg/mL (ref 2.3–4.2)

## 2022-10-19 LAB — T4, FREE: Free T4: 1.3 ng/dL (ref 0.8–1.8)

## 2022-10-19 NOTE — Telephone Encounter (Signed)
Order to Dr. Talmadge Coventry with [facility] at [place]. Patient scheduled for bilateral diagnostic MMG with left breast US on 10/25/22 at 0800.   Call placed to patient, left detailed message, ok per dpr. Advised of appt as seen above. If you need to make any changes to the appt, contact [facility] directly at [phone]. If you have any additional questions, return call to office at [phone], opt 4.   Routing to provider for final review. Patient is agreeable to disposition. Will close encounter.

## 2022-10-26 ENCOUNTER — Encounter: Payer: Self-pay | Admitting: Obstetrics and Gynecology

## 2022-10-31 ENCOUNTER — Encounter: Payer: Self-pay | Admitting: Obstetrics and Gynecology

## 2022-10-31 NOTE — Telephone Encounter (Signed)
Spoke with patient.  Reviewed 10/25/22 MMG results from [place]. Advised Next screening MMG due 10/26/2023.  Questions answered.   Routing to provider for final review. Patient is agreeable to disposition. Will close encounter.

## 2023-03-07 ENCOUNTER — Ambulatory Visit: Payer: 59 | Admitting: Family Medicine

## 2023-03-07 ENCOUNTER — Encounter: Payer: Self-pay | Admitting: Family Medicine

## 2023-03-07 VITALS — BP 94/62 | HR 88 | Temp 98.3°F | Resp 18 | Ht 61.5 in | Wt 115.6 lb

## 2023-03-07 DIAGNOSIS — Z7689 Persons encountering health services in other specified circumstances: Secondary | ICD-10-CM | POA: Diagnosis not present

## 2023-03-07 DIAGNOSIS — H9312 Tinnitus, left ear: Secondary | ICD-10-CM | POA: Diagnosis not present

## 2023-03-07 DIAGNOSIS — I959 Hypotension, unspecified: Secondary | ICD-10-CM | POA: Diagnosis not present

## 2023-03-07 NOTE — Progress Notes (Signed)
 New Patient Office Visit  Subjective    Patient ID: Maria Waller, female    DOB: 05/24/1980  Age: 43 y.o. MRN: [id number]  CC:  Chief Complaint  Patient presents with   Establish Care    Patient is here to establish care with new PCP, She states that her ears have been bothering since the New year. She states that her left ear is worse right , she did use OTC ear drops but they did not work. Patient states that she hears an echo in her left ear, but its not as bad as it was yesterday    HPI Maria Waller presents to establish care    Left ear symptoms Pt reports left ear ringing since January 1st. She denies ear pain. She has diminished hearing due to the ringing in her ear. No injury to the ear. No URI symptoms. She does have some nasal congestion but reports her right nose is always clogged up. She has been seeing ENT for her nasal issues and has appt next week. She has used ear wax drops in her left ear but hasn't helped. She does drink green tea one cup a day.  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 She also has her air pods that she uses to listen to music at least an hour a day before work.   Outpatient Encounter Medications as of 03/07/2023  Medication Sig   ibuprofen  (ADVIL ) 800 MG tablet Take 1 tablet (800 mg total) by mouth every 8 (eight) hours as needed.   Multiple Vitamin (MULTIVITAMIN WITH MINERALS) TABS tablet Take 1 tablet by mouth daily.   No facility-administered encounter medications on file as of 03/07/2023.    Past Medical History:  Diagnosis Date   Elevated LFTs 09/2021   History of COVID-19 02/2020   Infertility, female    Thyroid  disease    hyper, on meds x 1 year then OK   Vitamin D  insufficiency 10/03/2018    History reviewed. No pertinent surgical history.  Family History  Problem Relation Age of Onset   Gout Mother    Hyperlipidemia Mother    Hypertension Father    Stroke Father    Gout Father    Alcohol abuse Brother     Thyroid  disease Brother     Social History   Socioeconomic History   Marital status: Married    Spouse name: Not on file   Number of children: 0   Years of education: Not on file   Highest education level: Not on file  Occupational History   Not on file  Tobacco Use   Smoking status: Never    Passive exposure: Never   Smokeless tobacco: Never  Vaping Use   Vaping status: Never Used  Substance and Sexual Activity   Alcohol use: No    Alcohol/week: 0.0 standard drinks of alcohol   Drug use: No   Sexual activity: Not Currently    Partners: Male    Birth control/protection: None  Other Topics Concern   Not on file  Social History Narrative   Not on file   Social Drivers of Health   Financial Resource Strain: Not on file  Food Insecurity: Not on file  Transportation Needs: Not on file  Physical Activity: Not on file  Stress: Not on file  Social Connections: Unknown (07/08/2021)   Received from [facility], [facility]   Social Network    Social Network: Not on file  Intimate  Partner Violence: Unknown (06/01/2021)   Received from [facility], [facility]   HITS    Physically Hurt: Not on file    Insult or Talk Down To: Not on file    Threaten Physical Harm: Not on file    Scream or Curse: Not on file    Review of Systems  HENT:  Positive for tinnitus.   All other systems reviewed and are negative.      Objective    BP (!) 88/60   Pulse 88   Temp 98.3 F (36.8 C) (Oral)   Resp 18   Ht 5' 1.5 (1.562 m)   Wt 115 lb 9.6 oz (52.4 kg)   SpO2 100%   BMI 21.49 kg/m   Physical Exam Vitals and nursing note reviewed.  Constitutional:      Appearance: Normal appearance. She is normal weight.  HENT:     Head: Normocephalic and atraumatic.     Right Ear: Tympanic membrane, ear canal and external ear normal.     Left Ear: Tympanic membrane, ear canal and external ear normal.     Nose: Nose normal.     Mouth/Throat:     Mouth: Mucous membranes are  moist.     Pharynx: Oropharynx is clear.  Eyes:     Conjunctiva/sclera: Conjunctivae normal.     Pupils: Pupils are equal, round, and reactive to light.  Cardiovascular:     Rate and Rhythm: Normal rate and regular rhythm.     Pulses: Normal pulses.     Heart sounds: Normal heart sounds.  Pulmonary:     Effort: Pulmonary effort is normal.     Breath sounds: Normal breath sounds.  Skin:    General: Skin is warm.     Capillary Refill: Capillary refill takes less than 2 seconds.  Neurological:     General: No focal deficit present.     Mental Status: She is alert and oriented to person, place, and time. Mental status is at baseline.  Psychiatric:        Mood and Affect: Mood normal.        Behavior: Behavior normal.        Thought Content: Thought content normal.        Judgment: Judgment normal.       Assessment & Plan:   Problem List Items Addressed This Visit   None Encounter to establish care with new doctor  Tinnitus of left ear   Advised to drink more water, avoid tea, and air pods for the next week until seen by ENT.  Ear exam is normal. Could be multifactorial also noted low blood pressure today. Advised this could be from dehydration. Will increase water intake and return in 2 weeks for follow up.  No follow-ups on file.   Torrence CINDERELLA Barrier, MD

## 2023-03-20 ENCOUNTER — Ambulatory Visit: Payer: 59 | Admitting: Family Medicine

## 2023-10-24 ENCOUNTER — Encounter: Payer: Self-pay | Admitting: Obstetrics and Gynecology

## 2023-10-24 ENCOUNTER — Ambulatory Visit (INDEPENDENT_AMBULATORY_CARE_PROVIDER_SITE_OTHER): Payer: 59 | Admitting: Obstetrics and Gynecology

## 2023-10-24 ENCOUNTER — Other Ambulatory Visit: Payer: Self-pay | Admitting: *Deleted

## 2023-10-24 VITALS — BP 116/84 | HR 83 | Ht 62.0 in | Wt 113.0 lb

## 2023-10-24 DIAGNOSIS — Z01419 Encounter for gynecological examination (general) (routine) without abnormal findings: Secondary | ICD-10-CM | POA: Diagnosis not present

## 2023-10-24 DIAGNOSIS — Z8639 Personal history of other endocrine, nutritional and metabolic disease: Secondary | ICD-10-CM | POA: Diagnosis not present

## 2023-10-24 DIAGNOSIS — Z0000 Encounter for general adult medical examination without abnormal findings: Secondary | ICD-10-CM

## 2023-10-24 DIAGNOSIS — Z1331 Encounter for screening for depression: Secondary | ICD-10-CM

## 2023-10-24 LAB — COMPREHENSIVE METABOLIC PANEL WITH GFR
AG Ratio: 1.6 (calc) (ref 1.0–2.5)
ALT: 13 U/L (ref 6–29)
AST: 17 U/L (ref 10–30)
Albumin: 4.4 g/dL (ref 3.6–5.1)
Alkaline phosphatase (APISO): 40 U/L (ref 31–125)
BUN: 15 mg/dL (ref 7–25)
CO2: 24 mmol/L (ref 20–32)
Calcium: 9.1 mg/dL (ref 8.6–10.2)
Chloride: 105 mmol/L (ref 98–110)
Creat: 0.75 mg/dL (ref 0.50–0.99)
Globulin: 2.8 g/dL (ref 1.9–3.7)
Glucose, Bld: 97 mg/dL (ref 65–99)
Potassium: 4 mmol/L (ref 3.5–5.3)
Sodium: 136 mmol/L (ref 135–146)
Total Bilirubin: 0.5 mg/dL (ref 0.2–1.2)
Total Protein: 7.2 g/dL (ref 6.1–8.1)
eGFR: 102 mL/min/1.73m2 (ref >=60)

## 2023-10-24 LAB — CBC
HCT: 40.4 % (ref 35.0–45.0)
Hemoglobin: 12.6 g/dL (ref 11.7–15.5)
MCH: 26.2 pg — ABNORMAL LOW (ref 27.0–33.0)
MCHC: 31.2 g/dL — ABNORMAL LOW (ref 32.0–36.0)
MCV: 84 fL (ref 80.0–100.0)
MPV: 10.7 fL (ref 7.5–12.5)
Platelets: 338 Thousand/uL (ref 140–400)
RBC: 4.81 Million/uL (ref 3.80–5.10)
RDW: 11.9 % (ref 11.0–15.0)
WBC: 9 Thousand/uL (ref 3.8–10.8)

## 2023-10-24 LAB — LIPID PANEL
Cholesterol: 178 mg/dL (ref <200)
HDL: 65 mg/dL (ref >=50)
LDL Cholesterol (Calc): 91 mg/dL
Non-HDL Cholesterol (Calc): 113 mg/dL (ref <130)
Total CHOL/HDL Ratio: 2.7 (calc) (ref <5.0)
Triglycerides: 127 mg/dL (ref <150)

## 2023-10-24 LAB — T4, FREE: Free T4: 1.2 ng/dL (ref 0.8–1.8)

## 2023-10-24 LAB — TSH: TSH: 1.27 m[IU]/L

## 2023-10-24 LAB — T3, FREE: T3, Free: 2.8 pg/mL (ref 2.3–4.2)

## 2023-10-24 MED ORDER — IBUPROFEN 800 MG PO TABS: 800.0000 mg | ORAL_TABLET | Freq: Three times a day (TID) | ORAL | 2 refills | Status: AC | PRN

## 2023-10-24 NOTE — Patient Instructions (Addendum)
 Consider Dr. Dorothyann Byars for a primary care provider.   Her office is in [place].   EXERCISE AND DIET:  We recommended that you start or continue a regular exercise program for good health. Regular exercise means any activity that makes your heart beat faster and makes you sweat.  We recommend exercising at least 30 minutes per day at least 3 days a week, preferably 4 or 5.  We also recommend a diet low in fat and sugar.  Inactivity, poor dietary choices and obesity can cause diabetes, heart attack, stroke, and kidney damage, among others.    ALCOHOL AND SMOKING:  Women should limit their alcohol intake to no more than 7 drinks/beers/glasses of wine (combined, not each!) per week. Moderation of alcohol intake to this level decreases your risk of breast cancer and liver damage. And of course, no recreational drugs are part of a healthy lifestyle.  And absolutely no smoking or even second hand smoke. Most people know smoking can cause heart and lung diseases, but did you know it also contributes to weakening of your bones? Aging of your skin?  Yellowing of your teeth and nails?  CALCIUM AND VITAMIN D :  Adequate intake of calcium and Vitamin D  are recommended.  The recommendations for exact amounts of these supplements seem to change often, but generally speaking 600 mg of calcium (either carbonate or citrate) and 800 units of Vitamin D  per day seems prudent. Certain women may benefit from higher intake of Vitamin D .  If you are among these women, your doctor will have told you during your visit.    PAP SMEARS:  Pap smears, to check for cervical cancer or precancers,  have traditionally been done yearly, although recent scientific advances have shown that most women can have pap smears less often.  However, every woman still should have a physical exam from her gynecologist every year. It will include a breast check, inspection of the vulva and vagina to check for abnormal growths or skin changes,  a visual exam of the cervix, and then an exam to evaluate the size and shape of the uterus and ovaries.  And after 42 years of age, a rectal exam is indicated to check for rectal cancers. We will also provide age appropriate advice regarding health maintenance, like when you should have certain vaccines, screening for sexually transmitted diseases, bone density testing, colonoscopy, mammograms, etc.   MAMMOGRAMS:  All women over 10 years old should have a yearly mammogram. Many facilities now offer a 3D mammogram, which may cost around $50 extra out of pocket. If possible,  we recommend you accept the option to have the 3D mammogram performed.  It both reduces the number of women who will be called back for extra views which then turn out to be normal, and it is better than the routine mammogram at detecting truly abnormal areas.    COLONOSCOPY:  Colonoscopy to screen for colon cancer is recommended for all women at age 46.  We know, you hate the idea of the prep.  We agree, BUT, having colon cancer and not knowing it is worse!!  Colon cancer so often starts as a polyp that can be seen and removed at colonscopy, which can quite literally save your life!  And if your first colonoscopy is normal and you have no family history of colon cancer, most women don't have to have it again for 10 years.  Once every ten years, you can do something that may end up saving  your life, right?  We will be happy to help you get it scheduled when you are ready.  Be sure to check your insurance coverage so you understand how much it will cost.  It may be covered as a preventative service at no cost, but you should check your particular policy.

## 2023-10-24 NOTE — Progress Notes (Signed)
 43 y.o. G0P0 Married [place] female here for annual exam.    Ibuprofen  helps her cramping.  Wants a refill.   Has a yorkie.  Traveling back home to see her parents.   PCP: Pcp, No   Patient's last menstrual period was 09/23/2023 (approximate).     Period Cycle (Days): 28 Period Duration (Days): 2 Period Pattern: Regular Menstrual Flow: Light Menstrual Control: Maxi pad Dysmenorrhea: (!) Mild Dysmenorrhea Symptoms: Cramping     Sexually active: No.  The current method of family planning is none.    Menopausal hormone therapy:  n/a Exercising: Yes.    Walking Smoker:  no  OB History  Gravida Para Term Preterm AB Living  0 0      SAB IAB Ectopic Multiple Live Births           HEALTH MAINTENANCE: Last 2 paps:  10/12/21 ASCUS, HR HPV neg, 08/28/16 neg HPV neg History of abnormal Pap or positive HPV:  yes Mammogram:   10/25/22 - R breast U/S BIRADS Cat 2 benign. Scheduled for MMG  10/26/23 Colonoscopy:  n/a Bone Density:  n/a  Result  n/a   Immunization History  Administered Date(s) Administered   HPV 9-valent 10/02/2018, 12/19/2018, 04/15/2019   Tdap 08/02/2015      reports that she has never smoked. She has never been exposed to tobacco smoke. She has never used smokeless tobacco. She reports that she does not drink alcohol and does not use drugs.  Past Medical History:  Diagnosis Date   Elevated LFTs 09/2021   History of COVID-19 02/2020   Infertility, female    Thyroid  disease    hyper, on meds x 1 year then OK   Vitamin D  insufficiency 10/03/2018    History reviewed. No pertinent surgical history.  Current Outpatient Medications  Medication Sig Dispense Refill   ibuprofen  (ADVIL ) 800 MG tablet Take 1 tablet (800 mg total) by mouth every 8 (eight) hours as needed. 30 tablet 2   Multiple Vitamin (MULTIVITAMIN WITH MINERALS) TABS tablet Take 1 tablet by mouth daily.     No current facility-administered medications for this visit.    ALLERGIES:  Prednisone  Family History  Problem Relation Age of Onset   Gout Mother    Hyperlipidemia Mother    Hypertension Father    Stroke Father    Gout Father    Alcohol abuse Brother    Thyroid  disease Brother     Review of Systems  All other systems reviewed and are negative.   PHYSICAL EXAM:  BP 116/84 (BP Location: Left Arm, Patient Position: Sitting)   Pulse 83   Ht 5' 2 (1.575 m)   Wt 113 lb (51.3 kg)   LMP 09/23/2023 (Approximate)   SpO2 97%   BMI 20.67 kg/m     General appearance: alert, cooperative and appears stated age Head: normocephalic, without obvious abnormality, atraumatic Neck: no adenopathy, supple, symmetrical, trachea midline and thyroid  normal to inspection and palpation Lungs: clear to auscultation bilaterally Breasts: normal appearance, no masses or tenderness, No nipple retraction or dimpling, No nipple discharge or bleeding, No axillary adenopathy Heart: regular rate and rhythm Abdomen: soft, non-tender; no masses, no organomegaly Extremities: extremities normal, atraumatic, no cyanosis or edema Skin: skin color, texture, turgor normal. No rashes or lesions Lymph nodes: cervical, supraclavicular, and axillary nodes normal. Neurologic: grossly normal  Pelvic: External genitalia:  no lesions              No abnormal inguinal nodes palpated.  Urethra:  normal appearing urethra with no masses, tenderness or lesions              Bartholins and Skenes: normal                 Vagina: normal appearing vagina with normal color and discharge, no lesions              Cervix: no lesions              Pap taken: no Bimanual Exam:  Uterus:  normal size, contour, position, consistency, mobility, non-tender              Adnexa: no mass, fullness, tenderness              Rectal exam: yes.  Confirms.              Anus:  normal sphincter tone, no lesions  Chaperone was present for exam:  Dereck BROCKS, CMA  ASSESSMENT: Well woman visit with gynecologic  exam. Hx breast cysts. Dysmenorrhea.  Hx infertility.  Hx hyperthyroidism.  Normal TFTs. History of elevated LFTs.  Resolved.  Routine labs. PHQ-2-9: 0  PLAN: Mammogram screening discussed. Self breast awareness reviewed. Pap and HRV collected:  no.  Due in 2026. Guidelines for Calcium, Vitamin D , regular exercise program including cardiovascular and weight bearing exercise. Medication refills:  Ibuprofen  800 mg po q 8 hrs prn.  #30, RF 2. Patient will return for lab visit:  lipids, CMP, CBC, TSH, free T3, free T4.  Follow up:  yearly and prn.

## 2023-10-25 ENCOUNTER — Ambulatory Visit: Payer: Self-pay | Admitting: Obstetrics and Gynecology

## 2023-10-26 LAB — HM MAMMOGRAPHY

## 2023-11-02 ENCOUNTER — Ambulatory Visit (HOSPITAL_BASED_OUTPATIENT_CLINIC_OR_DEPARTMENT_OTHER): Payer: Self-pay | Admitting: Obstetrics and Gynecology

## 2024-01-30 ENCOUNTER — Encounter: Admitting: Family Medicine

## 2024-10-29 ENCOUNTER — Ambulatory Visit: Admitting: Obstetrics and Gynecology
